# Patient Record
Sex: Female | Born: 1980 | Hispanic: Yes | Marital: Married | Smoking: Never smoker
Health system: Southern US, Community
[De-identification: ages and names within clinical notes are randomized; demographics above are authoritative.]

## PROBLEM LIST (undated history)

## (undated) ENCOUNTER — Inpatient Hospital Stay (HOSPITAL_COMMUNITY): Payer: Self-pay

## (undated) DIAGNOSIS — F419 Anxiety disorder, unspecified: Secondary | ICD-10-CM

## (undated) DIAGNOSIS — F329 Major depressive disorder, single episode, unspecified: Secondary | ICD-10-CM

## (undated) DIAGNOSIS — F32A Depression, unspecified: Secondary | ICD-10-CM

## (undated) HISTORY — PX: NO PAST SURGERIES: SHX2092

## (undated) HISTORY — PX: WISDOM TOOTH EXTRACTION: SHX21

---

## 2016-05-19 ENCOUNTER — Encounter (HOSPITAL_COMMUNITY): Payer: Self-pay | Admitting: Emergency Medicine

## 2016-05-19 ENCOUNTER — Emergency Department (HOSPITAL_COMMUNITY)
Admission: EM | Admit: 2016-05-19 | Discharge: 2016-05-19 | Disposition: A | Discharge status: Home / Self Care | Payer: BLUE CROSS/BLUE SHIELD | Attending: Emergency Medicine | Admitting: Emergency Medicine

## 2016-05-19 ENCOUNTER — Emergency Department (HOSPITAL_COMMUNITY): Payer: BLUE CROSS/BLUE SHIELD

## 2016-05-19 DIAGNOSIS — R064 Hyperventilation: Secondary | ICD-10-CM | POA: Diagnosis not present

## 2016-05-19 DIAGNOSIS — R0602 Shortness of breath: Secondary | ICD-10-CM | POA: Diagnosis present

## 2016-05-19 DIAGNOSIS — R0600 Dyspnea, unspecified: Secondary | ICD-10-CM | POA: Diagnosis not present

## 2016-05-19 LAB — CBC
HCT: 39.9 % (ref 36.0–46.0)
HEMOGLOBIN: 13.4 g/dL (ref 12.0–15.0)
MCH: 29.3 pg (ref 26.0–34.0)
MCHC: 33.6 g/dL (ref 30.0–36.0)
MCV: 87.3 fL (ref 78.0–100.0)
Platelets: 422 10*3/uL — ABNORMAL HIGH (ref 150–400)
RBC: 4.57 MIL/uL (ref 3.87–5.11)
RDW: 13.5 % (ref 11.5–15.5)
WBC: 13.2 10*3/uL — AB (ref 4.0–10.5)

## 2016-05-19 LAB — BASIC METABOLIC PANEL
ANION GAP: 13 (ref 5–15)
BUN: 9 mg/dL (ref 6–20)
CHLORIDE: 105 mmol/L (ref 101–111)
CO2: 18 mmol/L — ABNORMAL LOW (ref 22–32)
CREATININE: 0.93 mg/dL (ref 0.44–1.00)
Calcium: 9.6 mg/dL (ref 8.9–10.3)
GFR calc non Af Amer: >60 mL/min (ref >60)
Glucose, Bld: 159 mg/dL — ABNORMAL HIGH (ref 65–99)
POTASSIUM: 3.7 mmol/L (ref 3.5–5.1)
Sodium: 136 mmol/L (ref 135–145)

## 2016-05-19 LAB — I-STAT TROPONIN, ED: TROPONIN I, POC: 0 ng/mL (ref 0.00–0.08)

## 2016-05-19 MED ORDER — LORAZEPAM 1 MG PO TABS
1.0000 mg | ORAL_TABLET | Freq: Once | ORAL | Status: AC
  Administered 2016-05-19: 1 mg via ORAL
  Filled 2016-05-19: qty 1

## 2016-05-19 NOTE — ED Provider Notes (Signed)
[facility]-EMERGENCY DEPT Provider Note   CSN: [id number] Arrival date & time: 05/19/16  2033  First Provider Contact:  None       History   Chief Complaint Chief Complaint  Patient presents with  . Shortness of Breath    HPI Sara Burns is a 35 y.o. female.  Patient c/o chest tightness, sob, gradual onset earlier today.  Patient w hx allergies. Also notes similar episodes sob/hyperventilation in past. Patient c/o tingling bil hands/feet, and spasm in wrists/feet. No lateralizing or focal chest pain. No exertional cp or discomfort. No pleuritic pain. No leg pain or swelling. No hx dvt or pe. No recent surgery, trauma, immobility, travel, smoking, hemoptysis. No fam hx cad. Denies chest wall injury or strain. Denies cough or fever. No hx asthma or mdi use.    The history is provided by the patient and the spouse.  Shortness of Breath  Pertinent negatives include no fever, no headaches, no sore throat, no neck pain, no cough, no vomiting, no abdominal pain, no rash and no leg swelling.    History reviewed. No pertinent past medical history.  There are no active problems to display for this patient.   History reviewed. No pertinent surgical history.  OB History    No data available       Home Medications    Prior to Admission medications   Not on File    Family History History reviewed. No pertinent family history.  Social History Social History  Substance Use Topics  . Smoking status: Never Smoker  . Smokeless tobacco: Never Used  . Alcohol use Yes     Comment: occasion     Allergies   Other   Review of Systems Review of Systems  Constitutional: Negative for chills and fever.  HENT: Negative for sore throat.   Eyes: Negative for redness.  Respiratory: Positive for shortness of breath. Negative for cough.   Cardiovascular: Negative for leg swelling.  Gastrointestinal: Negative for abdominal pain and vomiting.  Genitourinary: Negative for flank  pain.  Musculoskeletal: Negative for back pain and neck pain.  Skin: Negative for rash.  Neurological: Negative for headaches.  Hematological: Does not bruise/bleed easily.  Psychiatric/Behavioral: The patient is nervous/anxious.      Physical Exam Updated Vital Signs BP 108/71 (BP Location: Left Arm)   Pulse 107   Temp 98.1 F (36.7 C) (Axillary)   Resp 22   Ht  (1.727 m)   Wt 59 kg   LMP 04/28/2016 (Approximate)   SpO2 100%   BMI 19.77 kg/m   Physical Exam  Constitutional: She appears well-developed and well-nourished.  Patient very anxious, hyperventilating.   HENT:  Mouth/Throat: Oropharynx is clear and moist.  Eyes: Conjunctivae are normal. Pupils are equal, round, and reactive to light. No scleral icterus.  Neck: Normal range of motion. Neck supple. No tracheal deviation present. No thyromegaly present.  Cardiovascular: Regular rhythm, normal heart sounds and intact distal pulses.  Exam reveals no gallop and no friction rub.   No murmur heard. Pulmonary/Chest: Effort normal and breath sounds normal. No respiratory distress.  Abdominal: Soft. Normal appearance and bowel sounds are normal. She exhibits no distension. There is no tenderness.  Musculoskeletal: She exhibits no edema or tenderness.  Neurological: She is alert.  Skin: Skin is warm and dry. No rash noted.  Psychiatric:  Very anxious.   Nursing note and vitals reviewed.    ED Treatments / Results  Labs (all labs ordered are listed, but only abnormal  results are displayed) Results for orders placed or performed during the hospital encounter of 05/19/16  CBC  Result Value Ref Range   WBC 13.2 (H) 4.0 - 10.5 K/uL   RBC 4.57 3.87 - 5.11 MIL/uL   Hemoglobin 13.4 12.0 - 15.0 g/dL   HCT 16.1 09.6 - 04.5 %   MCV 87.3 78.0 - 100.0 fL   MCH 29.3 26.0 - 34.0 pg   MCHC 33.6 30.0 - 36.0 g/dL   RDW 40.9 81.1 - 91.4 %   Platelets 422 (H) 150 - 400 K/uL  Basic metabolic panel  Result Value Ref Range    Sodium 136 135 - 145 mmol/L   Potassium 3.7 3.5 - 5.1 mmol/L   Chloride 105 101 - 111 mmol/L   CO2 18 (L) 22 - 32 mmol/L   Glucose, Bld 159 (H) 65 - 99 mg/dL   BUN 9 6 - 20 mg/dL   Creatinine, Ser 7.82 0.44 - 1.00 mg/dL   Calcium 9.6 8.9 - 95.6 mg/dL   GFR calc non Af Amer >60 >60 mL/min   GFR calc Af Amer >60 >60 mL/min   Anion gap 13 5 - 15  I-stat troponin, ED  Result Value Ref Range   Troponin i, poc 0.00 0.00 - 0.08 ng/mL   Comment 3           Dg Chest 2 View  Result Date: 05/19/2016 CLINICAL DATA:  SOB of breath began this afternoon, worsening throughout the evening; Gasping for air; Pt states both arms have some numbness and tingling; She feels very cold EXAM: CHEST  2 VIEW COMPARISON:  None. FINDINGS: The heart size and mediastinal contours are within normal limits. Both lungs are clear. The visualized skeletal structures are unremarkable. IMPRESSION: No active cardiopulmonary disease. Electronically Signed   By: Burman Nieves M.D.   On: 05/19/2016 21:19    EKG  EKG Interpretation  Date/Time:  Sunday May 19 2016 20:47:36 EDT Ventricular Rate:  119 PR Interval:  164 QRS Duration: 92 QT Interval:  216 QTC Calculation: 303 R Axis:   92 Text Interpretation:  Sinus tachycardia Rightward axis Nonspecific ST and T wave abnormality No previous tracing Confirmed by Denton Lank  MD, Caryn Bee ([id number]) on 05/19/2016 9:18:34 PM       Radiology Dg Chest 2 View  Result Date: 05/19/2016 CLINICAL DATA:  SOB of breath began this afternoon, worsening throughout the evening; Gasping for air; Pt states both arms have some numbness and tingling; She feels very cold EXAM: CHEST  2 VIEW COMPARISON:  None. FINDINGS: The heart size and mediastinal contours are within normal limits. Both lungs are clear. The visualized skeletal structures are unremarkable. IMPRESSION: No active cardiopulmonary disease. Electronically Signed   By: Burman Nieves M.D.   On: 05/19/2016 21:19     Procedures Procedures (including critical care time)  Medications Ordered in ED Medications  LORazepam (ATIVAN) tablet 1 mg (not administered)     Initial Impression / Assessment and Plan / ED Course  I have reviewed the triage vital signs and the nursing notes.  Pertinent labs & imaging results that were available during my care of the patient were reviewed by me and considered in my medical decision making (see chart for details).  Clinical Course    Patient reassured, encouraged to slow breathing.   Ativan 1 mg po.  cxr from triage, neg acute.   Recheck, pt calm and alert. No sob or increased wob.  Patient currently appears stable for d/c.  Final Clinical Impressions(s) / ED Diagnoses   Final diagnoses:  None    New Prescriptions New Prescriptions   No medications on file     Cathren LaineKevin Chon Buhl, MD 05/19/16 2250

## 2016-05-19 NOTE — Discharge Instructions (Signed)
It was our pleasure to provide your ER care today - we hope that you feel better.  Rest.   Follow up with primary care doctor in the coming week.  Return to ER if worse, new symptoms, fevers, trouble breathing, chest pain, other concern.   You were given medication in the ED that causes drowsiness - no driving for the next 4 hours.

## 2016-05-19 NOTE — ED Triage Notes (Signed)
Patient will not talk or give any information about her condition other than stating she cannot breathe. Husband states she had SOB, began breathing very fast, and her hands felt numb and spasmed. Woke up with SOB, worsened throughout the day. Sats 100% on room air.

## 2016-05-19 NOTE — ED Notes (Signed)
Pt noted to have increased respiratory rate and c/o numbness/tingling in fingers. Pt coached to slow her breathing.

## 2016-10-30 ENCOUNTER — Ambulatory Visit (HOSPITAL_COMMUNITY)
Admission: EM | Admit: 2016-10-30 | Discharge: 2016-10-30 | Disposition: A | Discharge status: Home / Self Care | Payer: BLUE CROSS/BLUE SHIELD | Attending: Emergency Medicine | Admitting: Emergency Medicine

## 2016-10-30 ENCOUNTER — Encounter (HOSPITAL_COMMUNITY): Payer: Self-pay | Admitting: Emergency Medicine

## 2016-10-30 DIAGNOSIS — J0100 Acute maxillary sinusitis, unspecified: Secondary | ICD-10-CM | POA: Diagnosis not present

## 2016-10-30 MED ORDER — FLUCONAZOLE 150 MG PO TABS: 150.0000 mg | ORAL_TABLET | Freq: Every day | ORAL | 1 refills | Status: DC

## 2016-10-30 MED ORDER — IPRATROPIUM BROMIDE 0.06 % NA SOLN: 2.0000 | Freq: Four times a day (QID) | NASAL | 0 refills | Status: DC

## 2016-10-30 MED ORDER — AMOXICILLIN 875 MG PO TABS: 875.0000 mg | ORAL_TABLET | Freq: Two times a day (BID) | ORAL | 0 refills | Status: DC

## 2016-10-30 MED ORDER — METHYLPREDNISOLONE 4 MG PO TBPK: ORAL_TABLET | ORAL | 0 refills | Status: DC

## 2016-10-30 NOTE — ED Triage Notes (Signed)
Patient has symptoms for 2 weeks.  Patient has runny nose and nasal stuffiness, and having a bad headache

## 2016-10-30 NOTE — ED Provider Notes (Signed)
CSN: [id number]     Arrival date & time 10/30/16  1647 History   First MD Initiated Contact with Patient 10/30/16 1720     Chief Complaint  Patient presents with  . URI   (Consider location/radiation/quality/duration/timing/severity/associated sxs/prior Treatment) Patient is having sinus pressure and teeth discomfort and having nasal discharge x 2 weeks.     The history is provided by the patient.  URI  Presenting symptoms: congestion, facial pain, fatigue, fever and rhinorrhea   Presenting symptoms: no cough   Severity:  Moderate Onset quality:  Sudden Duration:  2 weeks Timing:  Constant Progression:  Worsening Chronicity:  New Relieved by:  Nothing Worsened by:  Nothing Ineffective treatments:  None tried   History reviewed. No pertinent past medical history. History reviewed. No pertinent surgical history. No family history on file. Social History  Substance Use Topics  . Smoking status: Never Smoker  . Smokeless tobacco: Never Used  . Alcohol use Yes     Comment: occasion   OB History    No data available     Review of Systems  Constitutional: Positive for fatigue and fever.  HENT: Positive for congestion and rhinorrhea.   Eyes: Negative.   Respiratory: Negative.  Negative for cough.   Cardiovascular: Negative.   Gastrointestinal: Negative.   Endocrine: Negative.   Genitourinary: Negative.   Musculoskeletal: Negative.   Allergic/Immunologic: Negative.   Neurological: Negative.   Hematological: Negative.   Psychiatric/Behavioral: Negative.     Allergies  Apple  Home Medications   Prior to Admission medications   Medication Sig Start Date End Date Taking? Authorizing Provider  amoxicillin (AMOXIL) 875 MG tablet Take 1 tablet (875 mg total) by mouth 2 (two) times daily. 10/30/16   Deatra CanterWilliam J Oxford, FNP  fluconazole (DIFLUCAN) 150 MG tablet Take 1 tablet (150 mg total) by mouth daily. 10/30/16   Deatra CanterWilliam J Oxford, FNP  ibuprofen (ADVIL,MOTRIN) 200 MG  tablet Take 400 mg by mouth 2 (two) times daily as needed for cramping.    Historical Provider, MD  ipratropium (ATROVENT) 0.06 % nasal spray Place 2 sprays into both nostrils 4 (four) times daily. 10/30/16   Deatra CanterWilliam J Oxford, FNP  loratadine (CLARITIN) 10 MG tablet Take 10 mg by mouth daily as needed for allergies.    Historical Provider, MD  methylPREDNISolone (MEDROL DOSEPAK) 4 MG TBPK tablet Take 6-5-4-3-2-1 po qd 10/30/16   Deatra CanterWilliam J Oxford, FNP  metroNIDAZOLE (FLAGYL) 500 MG tablet Take 500 mg by mouth 2 (two) times daily. 7 day course started 05/13/16 05/13/16   Historical Provider, MD  norethindrone-ethinyl estradiol (MICROGESTIN FE 1/20) 1-20 MG-MCG tablet Take 1 tablet by mouth at bedtime.    Historical Provider, MD   Meds Ordered and Administered this Visit  Medications - No data to display  Pulse 79   Temp 97.8 F (36.6 C) (Oral)   Resp 16   LMP 10/26/2016   SpO2 98%  No data found.   Physical Exam  Constitutional: She appears well-developed and well-nourished.  HENT:  Head: Normocephalic and atraumatic.  Right Ear: External ear normal.  Left Ear: External ear normal.  Mouth/Throat: Oropharynx is clear and moist.  Eyes: Conjunctivae and EOM are normal. Pupils are equal, round, and reactive to light.  Neck: Normal range of motion. Neck supple.  Cardiovascular: Normal rate, regular rhythm and normal heart sounds.   Pulmonary/Chest: Effort normal and breath sounds normal.  Abdominal: Soft. Bowel sounds are normal.  Nursing note and vitals reviewed.   Urgent  Care Course     Procedures (including critical care time)  Labs Review Labs Reviewed - No data to display  Imaging Review No results found.   Visual Acuity Review  Right Eye Distance:   Left Eye Distance:   Bilateral Distance:    Right Eye Near:   Left Eye Near:    Bilateral Near:         MDM   1. Subacute maxillary sinusitis    Amoxicillin 875mg  one po bid x 10 days Medrol dose pack as directed  4mg  #21 Ipratropium Nasal Spray 2 sprays per nostril Diflucan 150mg  one po qd x 2d w/ 1rf      Deatra Canter, FNP 10/30/16 1731

## 2017-01-21 ENCOUNTER — Encounter (HOSPITAL_COMMUNITY): Payer: Self-pay | Admitting: Emergency Medicine

## 2017-01-21 ENCOUNTER — Ambulatory Visit (HOSPITAL_COMMUNITY)
Admission: EM | Admit: 2017-01-21 | Discharge: 2017-01-21 | Disposition: A | Discharge status: Home / Self Care | Payer: BLUE CROSS/BLUE SHIELD | Attending: Internal Medicine | Admitting: Internal Medicine

## 2017-01-21 DIAGNOSIS — J309 Allergic rhinitis, unspecified: Secondary | ICD-10-CM

## 2017-01-21 DIAGNOSIS — J301 Allergic rhinitis due to pollen: Secondary | ICD-10-CM

## 2017-01-21 MED ORDER — METHYLPREDNISOLONE 4 MG PO TBPK: ORAL_TABLET | ORAL | 0 refills | Status: DC

## 2017-01-21 NOTE — ED Provider Notes (Signed)
CSN: [id number]     Arrival date & time 01/21/17  1112 History   First MD Initiated Contact with Patient 01/21/17 1142     Chief Complaint  Patient presents with  . URI   (Consider location/radiation/quality/duration/timing/severity/associated sxs/prior Treatment) 36 year old female with a history of allergies presents with runny nose, facial pressure around the eyes and paranasal sinuses and forehead, decreased energy,  and sniffles. She states initially the drainage was clear and now has turned to a different color. Denies any fever. She denies perceived PND. Symptoms started approximate 7 days ago. She has tried some pills but after taking one or 2 it did not work. She does not like to use nasal sprays.      History reviewed. No pertinent past medical history. History reviewed. No pertinent surgical history. No family history on file. Social History  Substance Use Topics  . Smoking status: Never Smoker  . Smokeless tobacco: Never Used  . Alcohol use Yes     Comment: occasion   OB History    No data available     Review of Systems  Constitutional: Positive for fatigue. Negative for activity change, appetite change, chills and fever.  HENT: Positive for congestion, postnasal drip, rhinorrhea, sinus pain and sinus pressure. Negative for ear pain, facial swelling and sore throat.   Eyes: Negative.   Respiratory: Negative.   Cardiovascular: Negative.   Musculoskeletal: Negative for neck pain and neck stiffness.  Skin: Negative for pallor and rash.  Neurological: Negative.   All other systems reviewed and are negative.   Allergies  Apple  Home Medications   Prior to Admission medications   Medication Sig Start Date End Date Taking? Authorizing Provider  ibuprofen (ADVIL,MOTRIN) 200 MG tablet Take 400 mg by mouth 2 (two) times daily as needed for cramping.    Historical Provider, MD  loratadine (CLARITIN) 10 MG tablet Take 10 mg by mouth daily as needed for allergies.     Historical Provider, MD  methylPREDNISolone (MEDROL DOSEPAK) 4 MG TBPK tablet follow package directions 01/21/17   Hayden Rasmussen, NP  norethindrone-ethinyl estradiol (MICROGESTIN FE 1/20) 1-20 MG-MCG tablet Take 1 tablet by mouth at bedtime.    Historical Provider, MD   Meds Ordered and Administered this Visit  Medications - No data to display  BP 113/81 (BP Location: Left Arm)   Pulse 88   Temp 98.4 F (36.9 C) (Oral)   Resp 16   LMP 01/14/2017   SpO2 100%  No data found.   Physical Exam  Constitutional: She is oriented to person, place, and time. She appears well-developed and well-nourished. No distress.  HENT:  Mouth/Throat: No oropharyngeal exudate.  Bilateral TMs are mildly retracted. Oropharynx with erythema, light cobblestoning and moderate amount of clear PND.  Eyes: EOM are normal.  Neck: Normal range of motion. Neck supple.  Cardiovascular: Normal rate, regular rhythm and normal heart sounds.   Pulmonary/Chest: Effort normal and breath sounds normal. No respiratory distress. She has no wheezes. She has no rales.  Musculoskeletal: Normal range of motion. She exhibits no edema.  Lymphadenopathy:    She has no cervical adenopathy.  Neurological: She is alert and oriented to person, place, and time.  Skin: Skin is warm and dry. No rash noted.  Psychiatric: She has a normal mood and affect.  Nursing note and vitals reviewed.   Urgent Care Course     Procedures (including critical care time)  Labs Review Labs Reviewed - No data to display  Imaging Review  No results found.   Visual Acuity Review  Right Eye Distance:   Left Eye Distance:   Bilateral Distance:    Right Eye Near:   Left Eye Near:    Bilateral Near:         MDM   1. Allergic rhinitis due to pollen, unspecified seasonality   2. Allergic sinusitis    The most likely reason for your sinus congestion, runny nose and drainage in the back of the throat is due to allergies. It is important that  you take the medication as directed. Be sure to start taking the Medrol dosepak as soon as possible. The following list of medications will also help with your symptoms. You have to be consistent with taking the medicines. One or 2 doses will not be sufficient. Sudafed PE 10 mg every 4 to 6 hours as needed for congestion Allegra or Zyrtec daily as needed for drainage and runny nose. For stronger antihistamine may take Chlor-Trimeton 2 to 4 mg every 4 to 6 hours, may cause drowsiness. Saline nasal spray used frequently. Ibuprofen 600 mg every 6 hours as needed for pain, discomfort or fever but only after completing the prednisone. Drink plenty of fluids and stay well-hydrated.    Hayden Rasmussen, NP 01/21/17 [phone]

## 2017-01-21 NOTE — Discharge Instructions (Signed)
The most likely reason for your sinus congestion, runny nose and drainage in the back of the throat is due to allergies. It is important that you take the medication as directed. Be sure to start taking the Medrol dosepak as soon as possible. The following list of medications will also help with your symptoms. You have to be consistent with taking the medicines. One or 2 doses will not be sufficient. Sudafed PE 10 mg every 4 to 6 hours as needed for congestion Allegra or Zyrtec daily as needed for drainage and runny nose. For stronger antihistamine may take Chlor-Trimeton 2 to 4 mg every 4 to 6 hours, may cause drowsiness. Saline nasal spray used frequently. Ibuprofen 600 mg every 6 hours as needed for pain, discomfort or fever but only after completing the prednisone. Drink plenty of fluids and stay well-hydrated. Flonase or Rhinocort nasal spray daily

## 2017-01-21 NOTE — ED Triage Notes (Signed)
Symptoms started a week ago.  Symptoms include a runny nose, head congestion, no energy, facial pain.

## 2017-05-06 IMAGING — DX DG CHEST 2V
2 series · 2 of 2 positions shown · non-contrast
Comparison: None.

CLINICAL DATA: SOB of breath began this afternoon, worsening
throughout the evening; Gasping for air; Pt states both arms have
some numbness and tingling; She feels very cold

EXAM:
CHEST  2 VIEW

[chest pa]
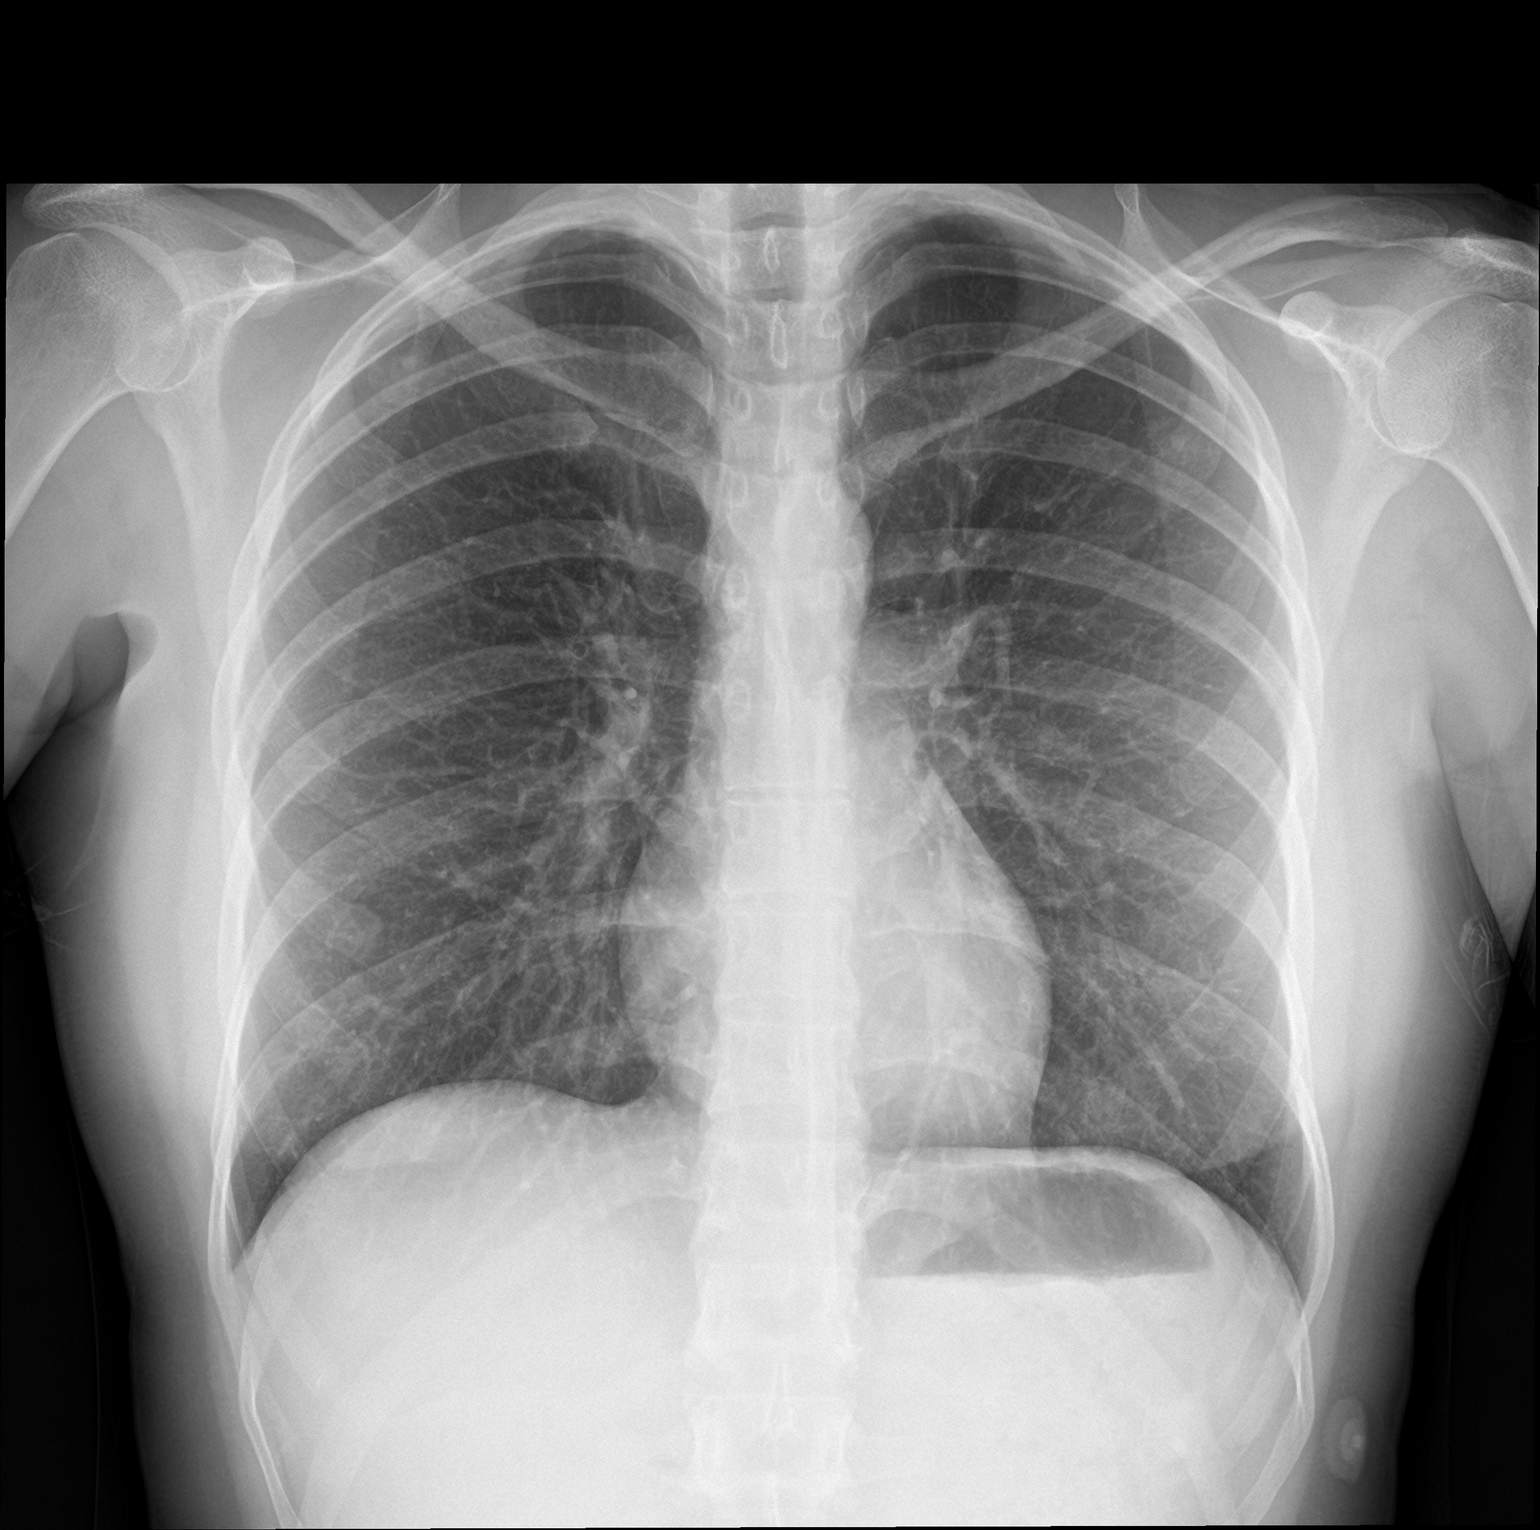

[chest lat]
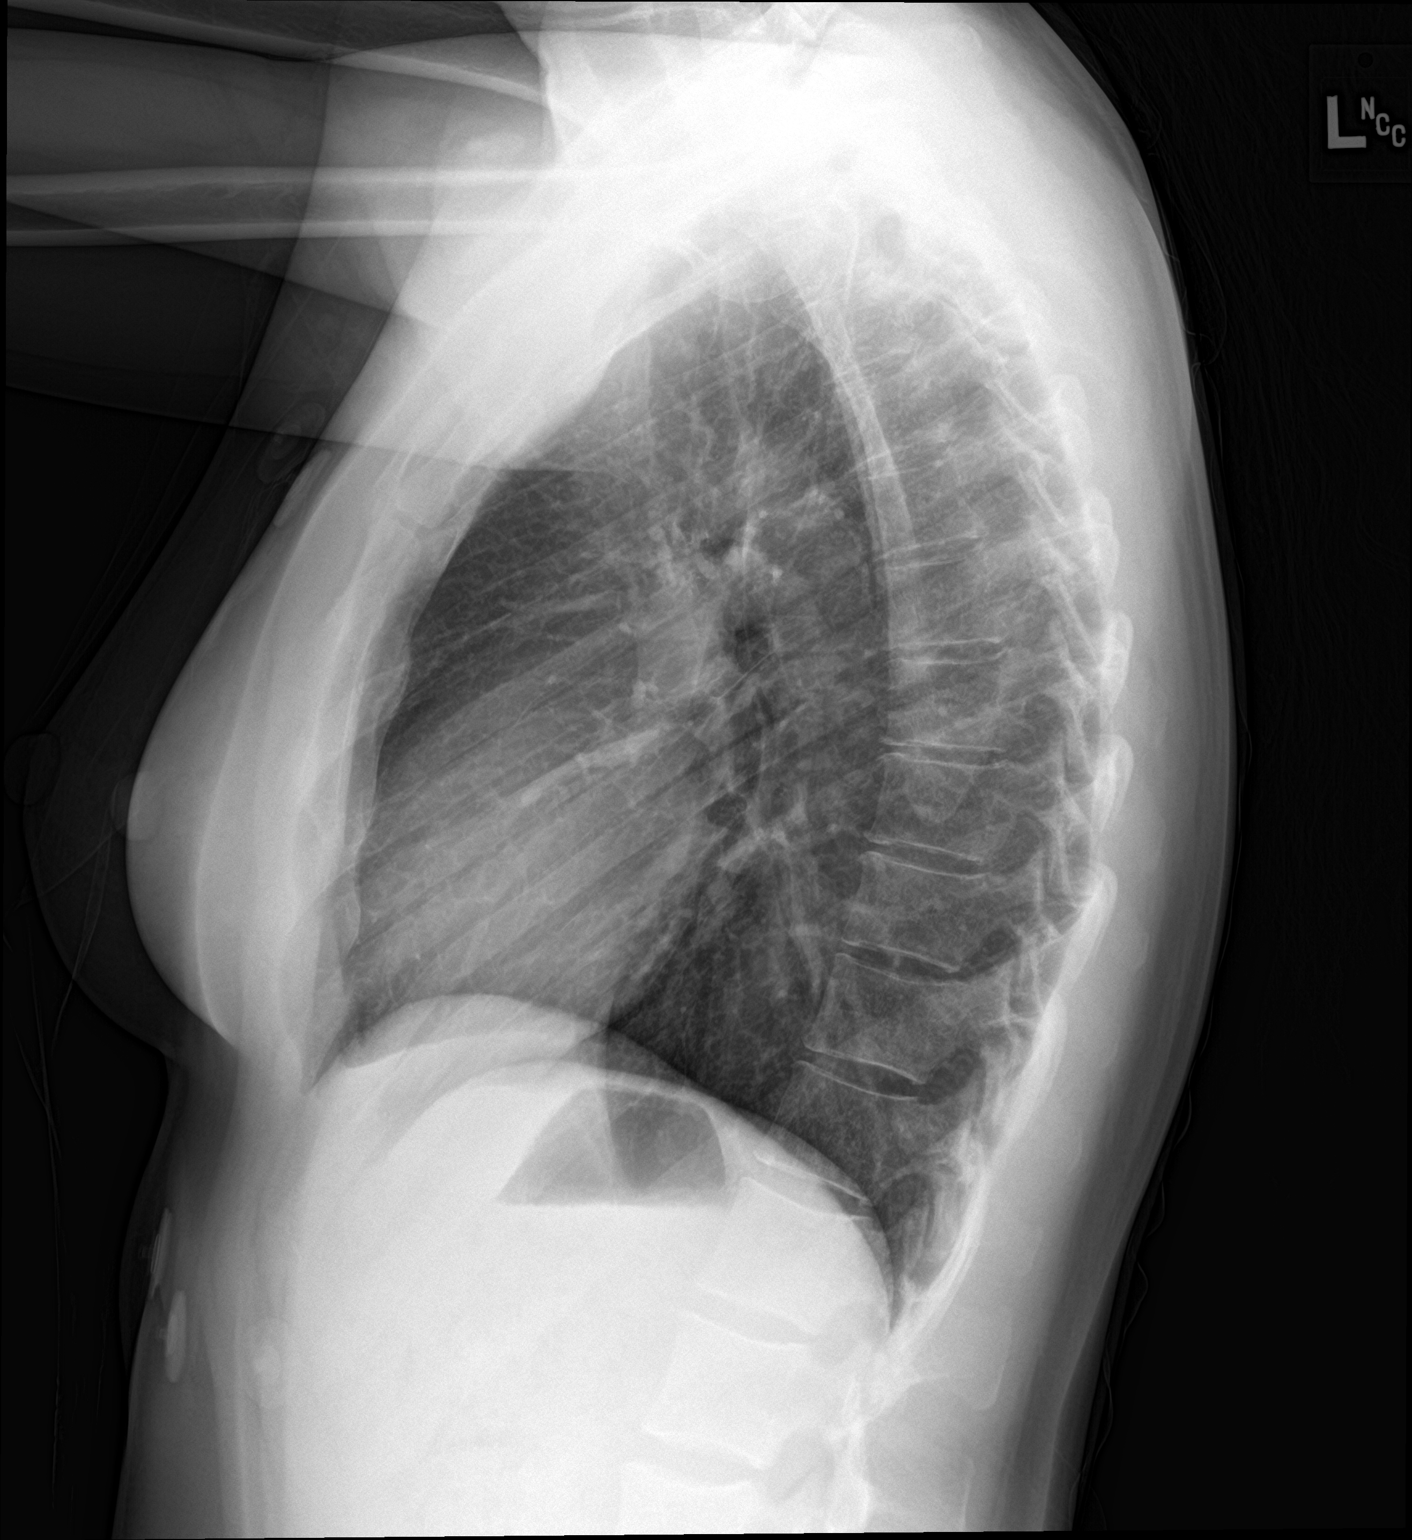

[2 of 2 positions shown; findings below may reference images not displayed]

FINDINGS: The heart size and mediastinal contours are within normal limits.
Both lungs are clear. The visualized skeletal structures are
unremarkable.
IMPRESSION: No active cardiopulmonary disease.

## 2017-07-24 LAB — OB RESULTS CONSOLE RUBELLA ANTIBODY, IGM: Rubella: IMMUNE

## 2017-07-24 LAB — OB RESULTS CONSOLE GC/CHLAMYDIA
Chlamydia: NEGATIVE
GC PROBE AMP, GENITAL: NEGATIVE

## 2017-07-24 LAB — OB RESULTS CONSOLE ABO/RH: RH Type: POSITIVE

## 2017-07-24 LAB — OB RESULTS CONSOLE HIV ANTIBODY (ROUTINE TESTING): HIV: NONREACTIVE

## 2017-07-24 LAB — OB RESULTS CONSOLE HEPATITIS B SURFACE ANTIGEN: Hepatitis B Surface Ag: NEGATIVE

## 2017-07-24 LAB — OB RESULTS CONSOLE RPR: RPR: NONREACTIVE

## 2017-07-24 LAB — OB RESULTS CONSOLE ANTIBODY SCREEN: ANTIBODY SCREEN: NEGATIVE

## 2017-10-04 ENCOUNTER — Encounter (HOSPITAL_COMMUNITY): Payer: Self-pay | Admitting: *Deleted

## 2017-10-04 ENCOUNTER — Other Ambulatory Visit: Payer: Self-pay

## 2017-10-04 ENCOUNTER — Observation Stay (HOSPITAL_COMMUNITY)
Admission: AD | Admit: 2017-10-04 | Discharge: 2017-10-06 | Disposition: A | Discharge status: Home / Self Care | Payer: BLUE CROSS/BLUE SHIELD | Source: Ambulatory Visit | Attending: Obstetrics and Gynecology | Admitting: Obstetrics and Gynecology

## 2017-10-04 DIAGNOSIS — Z3A21 21 weeks gestation of pregnancy: Secondary | ICD-10-CM | POA: Diagnosis not present

## 2017-10-04 DIAGNOSIS — O23512 Infections of cervix in pregnancy, second trimester: Secondary | ICD-10-CM | POA: Diagnosis not present

## 2017-10-04 DIAGNOSIS — Z8751 Personal history of pre-term labor: Secondary | ICD-10-CM | POA: Diagnosis not present

## 2017-10-04 DIAGNOSIS — B9689 Other specified bacterial agents as the cause of diseases classified elsewhere: Secondary | ICD-10-CM | POA: Diagnosis not present

## 2017-10-04 DIAGNOSIS — Z3686 Encounter for antenatal screening for cervical length: Secondary | ICD-10-CM | POA: Insufficient documentation

## 2017-10-04 DIAGNOSIS — O479 False labor, unspecified: Secondary | ICD-10-CM | POA: Diagnosis present

## 2017-10-04 DIAGNOSIS — Z79899 Other long term (current) drug therapy: Secondary | ICD-10-CM | POA: Diagnosis not present

## 2017-10-04 DIAGNOSIS — O4700 False labor before 37 completed weeks of gestation, unspecified trimester: Secondary | ICD-10-CM

## 2017-10-04 DIAGNOSIS — O4702 False labor before 37 completed weeks of gestation, second trimester: Secondary | ICD-10-CM | POA: Diagnosis not present

## 2017-10-04 LAB — WET PREP, GENITAL
Sperm: NONE SEEN
TRICH WET PREP: NONE SEEN
YEAST WET PREP: NONE SEEN

## 2017-10-04 LAB — URINALYSIS, ROUTINE W REFLEX MICROSCOPIC
BILIRUBIN URINE: NEGATIVE
Glucose, UA: NEGATIVE mg/dL
HGB URINE DIPSTICK: NEGATIVE
KETONES UR: NEGATIVE mg/dL
NITRITE: NEGATIVE
PH: 6 (ref 5.0–8.0)
Protein, ur: NEGATIVE mg/dL
Specific Gravity, Urine: 1.008 (ref 1.005–1.030)

## 2017-10-04 MED ORDER — LACTATED RINGERS IV BOLUS (SEPSIS)
1000.0000 mL | Freq: Once | INTRAVENOUS | Status: AC
  Administered 2017-10-05: 1000 mL via INTRAVENOUS

## 2017-10-04 MED ORDER — NIFEDIPINE 10 MG PO CAPS
20.0000 mg | ORAL_CAPSULE | Freq: Once | ORAL | Status: AC
  Administered 2017-10-04: 20 mg via ORAL
  Filled 2017-10-04: qty 2

## 2017-10-04 NOTE — MAU Note (Signed)
Sara Burns CNM aware of bp of 90/65. OK to give po Procardia 20mg  as ordered

## 2017-10-04 NOTE — Progress Notes (Signed)
toco only applied as pt concerned may be contracting.

## 2017-10-04 NOTE — MAU Provider Note (Signed)
CC:  Chief Complaint  Patient presents with  . Abdominal Pain  . Back Pain     First Provider Initiated Contact with Patient 10/04/17 2300      HPI: Sara Burns is a 36 y.o. year old G3P0111 female at 6668w3d weeks gestation who presents to MAU reporting contractions since this morning that are now every 5 minutes. Pt denies any problems w/ preterm contractions this pregnancy. State she was told that her anatomy US was Nml. Records not available for review. Hx spontaneous PTD at 36 weeks w/ first baby. Not on 17-P.  Associated Sx:  Vaginal bleeding: denies Leaking of fluid: denies Fetal movement: Nml  O:  Patient Vitals for the past 24 hrs:  BP Temp Pulse Resp Height Weight  10/04/17 2140 (!) 105/58 98 F (36.7 C) 94 18 5\' 6"  (1.676 m) 139 lb (63 kg)    General: NAD Heart: Regular rate Lungs: Normal rate and effort Abd: Soft, NT, Gravid, S=D Pelvic: NEFG, Neg pooling, Neg  blood.  Dilation: Closed/50 Exam by:: Dorathy KinsmanVirginia Lalitha Ilyas CNM  FHR 145 by doppler Toco: Contractions every 2-5 minutes, mild-moderate  [place] Mfm Ob Transvaginal OB History  Gravidity:    3         Term:   0        Prem:   1        SAB:   1  TOP:          0       Ectopic:  0        Living: 1  Fetal Evaluation  Num Of Fetuses:     1   Fetal Heart         159  Rate(bpm)  Presentation:       Cephalic   Placenta:           Posterior, above cervical os   Amniotic Fluid  AFI FV:      Subjectively within normal limits  Gestational Age  Clinical EDD:  21w 4d   EDD:   02/11/18  Best:          21w 4d     Det. By:  Clinical EDD             EDD:   02/11/18 Cervix Uterus Adnexa  Cervix  Length: 3.2  cm.  Normal appearance by transvaginal scan   Uterus  ? Gartner duct cyst noted measuring 2.9 x 2.3 x 0.9cm.   Impression  Indication: 36 yr old G3P0111 at 5943w4d with abdominal pain  for cervical length. Remote read.  Findings:  1. Single intrauterine pregnancy.  2. Posterior placenta without evidence of previa.  4.  Normal amniotic fluid volume.  5. Normal transvaginal cervical length.  Recommendations  1. Normal cervical length.  2. Management per primary OB. Erle CrockerKristen H Quinn, MD Electronically Signed Final Report   10/05/2017 11:54 pm   MAU COURSE/MDM Orders Placed This Encounter  Procedures  . US MFM OB Transvaginal  . Urinalysis, Routine w reflex microscopic  . Insert peripheral IV   Meds ordered this encounter  Medications  . NIFEdipine (PROCARDIA) capsule 20 mg  . lactated ringers bolus 1,000 mL   Discussed Hx, labs, exam w/ Dr. Dareen PianoAnderson. Agrees w/ POC. New orders: Procardia, IV bolus, US for CL.   Informed Dr. Dareen PianoAnderson of Nml CL per US, but no improvement of contractions. Will admit for Mag.  A: 5868w3d week IUP Frequent preterm contractions FHR reactive  P: Admit to  L&D per consult w/ Levi AlandAnderson, Mark E, MD  Mag Sulfate  [place], [place], [place] 10/04/2017 11:21 PM  3

## 2017-10-04 NOTE — Progress Notes (Signed)
Po flds to pt

## 2017-10-04 NOTE — MAU Note (Signed)
Pain in lower abd and lower back since this am. Getting worse. Comes and goes. Denies LOF or bleeding.

## 2017-10-05 ENCOUNTER — Inpatient Hospital Stay (HOSPITAL_COMMUNITY): Payer: BLUE CROSS/BLUE SHIELD

## 2017-10-05 DIAGNOSIS — O479 False labor, unspecified: Secondary | ICD-10-CM | POA: Diagnosis present

## 2017-10-05 DIAGNOSIS — O4700 False labor before 37 completed weeks of gestation, unspecified trimester: Secondary | ICD-10-CM | POA: Diagnosis present

## 2017-10-05 DIAGNOSIS — Z3A21 21 weeks gestation of pregnancy: Secondary | ICD-10-CM

## 2017-10-05 LAB — ABO/RH: ABO/RH(D): B POS

## 2017-10-05 LAB — TYPE AND SCREEN
ABO/RH(D): B POS
Antibody Screen: NEGATIVE

## 2017-10-05 MED ORDER — ZOLPIDEM TARTRATE 5 MG PO TABS: 5.0000 mg | ORAL_TABLET | Freq: Every evening | ORAL | Status: DC | PRN

## 2017-10-05 MED ORDER — MAGNESIUM SULFATE BOLUS VIA INFUSION
4.0000 g | Freq: Once | INTRAVENOUS | Status: AC
  Administered 2017-10-05: 4 g via INTRAVENOUS
  Filled 2017-10-05: qty 500

## 2017-10-05 MED ORDER — DOCUSATE SODIUM 100 MG PO CAPS
100.0000 mg | ORAL_CAPSULE | Freq: Every day | ORAL | Status: DC
Start: 2017-10-05 — End: 2017-10-06
  Administered 2017-10-05: 100 mg via ORAL
  Filled 2017-10-05: qty 1

## 2017-10-05 MED ORDER — LACTATED RINGERS IV SOLN
INTRAVENOUS | Status: DC
  Administered 2017-10-05 (×3): via INTRAVENOUS

## 2017-10-05 MED ORDER — CALCIUM CARBONATE ANTACID 500 MG PO CHEW: 2.0000 | CHEWABLE_TABLET | ORAL | Status: DC | PRN

## 2017-10-05 MED ORDER — MAGNESIUM SULFATE 40 G IN LACTATED RINGERS - SIMPLE
2.0000 g/h | INTRAVENOUS | Status: DC
  Administered 2017-10-05 (×2): 2 g/h via INTRAVENOUS
  Filled 2017-10-05 (×2): qty 40

## 2017-10-05 MED ORDER — PRENATAL MULTIVITAMIN CH
1.0000 | ORAL_TABLET | Freq: Every day | ORAL | Status: DC
Start: 2017-10-05 — End: 2017-10-06
  Administered 2017-10-05: 1 via ORAL
  Filled 2017-10-05: qty 1

## 2017-10-05 MED ORDER — ACETAMINOPHEN 325 MG PO TABS: 650.0000 mg | ORAL_TABLET | ORAL | Status: DC | PRN

## 2017-10-05 MED ORDER — METRONIDAZOLE 500 MG PO TABS
500.0000 mg | ORAL_TABLET | Freq: Two times a day (BID) | ORAL | Status: DC
  Administered 2017-10-05 (×2): 500 mg via ORAL
  Filled 2017-10-05 (×2): qty 1

## 2017-10-05 MED ORDER — BETAMETHASONE SOD PHOS & ACET 6 (3-3) MG/ML IJ SUSP
12.0000 mg | Freq: Once | INTRAMUSCULAR | Status: AC
  Administered 2017-10-05: 12 mg via INTRAMUSCULAR
  Filled 2017-10-05: qty 2

## 2017-10-05 MED ORDER — NIFEDIPINE 10 MG PO CAPS: 10.0000 mg | ORAL_CAPSULE | Freq: Once | ORAL | Status: DC

## 2017-10-05 MED ORDER — TERBUTALINE SULFATE 1 MG/ML IJ SOLN
0.2500 mg | Freq: Once | INTRAMUSCULAR | Status: AC
  Administered 2017-10-05: 0.25 mg via SUBCUTANEOUS
  Filled 2017-10-05: qty 1

## 2017-10-05 NOTE — Progress Notes (Signed)
RN called provider- Dr. Dareen PianoAnderson for orders. Pt was contraction frequently 2-6 and rating her pain 8/10. Dr. Dareen PianoAnderson ordered Procardia 10 mg po x 1.

## 2017-10-05 NOTE — Progress Notes (Signed)
To us via wc.

## 2017-10-05 NOTE — Progress Notes (Signed)
To [facility] via w/c

## 2017-10-05 NOTE — H&P (Signed)
Pt is a 36 y/o hispanic female, [id number] at 5421 4/7 wks who presented to the ER c/o LAP which began early Sat am. She was noted to be having Ctxs q 2-4 mins. She was given IVFs and po procardia. She continued to have painful ctxs and was admitted. She had a BV infection. Her u/a appears contaminated. It was sent for culture. She has a h.o. A preterm birth at 4036 weeks secondary to PPROM. After admission she had an u/s which indicated that her cx was 3.2cm History reviewed. No pertinent past medical history.  History reviewed. No pertinent surgical history.  History reviewed. No pertinent family history. Social History:  reports that  has never smoked. she has never used smokeless tobacco. She reports that she drinks alcohol. She reports that she does not use drugs.  Allergies:  Allergies  Allergen Reactions  . Apple Itching    Tongue itches    Medications Prior to Admission  Medication Sig Dispense Refill  . acetaminophen (TYLENOL) 325 MG tablet Take 650 mg by mouth every 6 (six) hours as needed.    Marland Kitchen. guaifenesin (ROBITUSSIN) 100 MG/5ML syrup Take 200 mg by mouth 3 (three) times daily as needed for cough.    Marland Kitchen. ibuprofen (ADVIL,MOTRIN) 200 MG tablet Take 400 mg by mouth 2 (two) times daily as needed for cramping.    . loratadine (CLARITIN) 10 MG tablet Take 10 mg by mouth daily as needed for allergies.    . methylPREDNISolone (MEDROL DOSEPAK) 4 MG TBPK tablet follow package directions 21 tablet 0  . norethindrone-ethinyl estradiol (MICROGESTIN FE 1/20) 1-20 MG-MCG tablet Take 1 tablet by mouth at bedtime.         Blood pressure (!) 105/50, pulse (!) 122, temperature 98.2 F (36.8 C), temperature source Oral, resp. rate 18, height 5\' 6"  (1.676 m), weight 139 lb (63 kg), last menstrual period 01/14/2017, SpO2 100 %. General appearance: alert, cooperative and mild distress Back: negative Abdomen: gravid, non tender ,palp ctxs Cx- long/closed this am  Lab Results  Component Value Date   WBC 13.2 (H) 05/19/2016   HGB 13.4 05/19/2016   HCT 39.9 05/19/2016   MCV 87.3 05/19/2016   PLT 422 (H) 05/19/2016   No results found for: PREGTESTUR, PREGSERUM, HCG, HCGQUANT     Patient Active Problem List   Diagnosis Date Noted  . Preterm contractions 10/05/2017   IMP/ IUP at 21 4/7 wks         Painful ctxs not responding to IVFs/procardia Plan/ Admit          Start MgSO4          Tx BV          Check u/a culture            ANDERSON,MARK E 10/05/2017, 8:30 AM

## 2017-10-05 NOTE — Progress Notes (Signed)
Up to BR earlier

## 2017-10-05 NOTE — Progress Notes (Signed)
Pt was c/o painful ctxs, 8/10 . She was ctxing q 3-5 min. She was given SQ terb and ctxs stopped.  Will tx BV and follow.  Continue MgSO4

## 2017-10-05 NOTE — Progress Notes (Signed)
Transferred care to [place] LPV[place]ica Mensah RN.

## 2017-10-05 NOTE — Progress Notes (Signed)
toco reapplied after u/s while waiting for results

## 2017-10-06 ENCOUNTER — Other Ambulatory Visit: Payer: Self-pay

## 2017-10-06 LAB — GC/CHLAMYDIA PROBE AMP (~~LOC~~) NOT AT ARMC
CHLAMYDIA, DNA PROBE: NEGATIVE
Neisseria Gonorrhea: NEGATIVE

## 2017-10-06 LAB — CULTURE, OB URINE: CULTURE: NO GROWTH

## 2017-10-06 MED ORDER — HYDROXYPROGESTERONE CAPROATE 250 MG/ML IM OIL
250.0000 mg | TOPICAL_OIL | Freq: Once | INTRAMUSCULAR | Status: AC
  Administered 2017-10-06: 250 mg via INTRAMUSCULAR
  Filled 2017-10-06: qty 1

## 2017-10-06 MED ORDER — METRONIDAZOLE 500 MG PO TABS: 500.0000 mg | ORAL_TABLET | Freq: Two times a day (BID) | ORAL | 0 refills | Status: DC

## 2017-10-06 NOTE — Progress Notes (Signed)
Discharge instructions reviewed with patient including medication changes and antibiotic therapy .

## 2017-10-06 NOTE — Progress Notes (Signed)
Patient admitted over the weekend with painful contractions.  She reports cramping that started Saturday morning and progressed to painful contractions by the evening, so she presented to the MAU. She received IVF, po procardia in MAU with no improvement.  Cervix remained closed, but given significant pain, she was admitted for observation.  There was some initial confusion re: gestational age, so she was started on magnesium and given a dose of BMZ.  She also received 1 dose of [place] terbutaline.  Contractions stopped yesterday and magnesium was discontinued.    She had an US that showed a TVCL of 3.2 cm.  US also notes ? Gartner duct cyst measuring 2.9 x 2.3 cm.   She has a h/o PTB at 36 wks following PPROM.  Has not been on 17-P this pregnancy.    This AM, she is feeling well.  Denies any painful contractions since yesterday morning.  Denies vb/lof.  Reports FM.    BP 93/60 (BP Location: Left Arm)   Pulse 100   Temp 98.1 F (36.7 C) (Oral)   Resp 18   Ht 5\' 6"  (1.676 m)   Wt 63 kg (139 lb)   LMP 01/14/2017   SpO2 100%   BMI 22.44 kg/m  NAD Abd soft, gravid SVE: Closed/50/posterior   A&P: [id number] @ 3132w5d w preterm contractions Preterm contractions have resolved.  Given GA < 23 weeks, no indication for second dose of bmz or continued observation at this time Discussed role of 17-P in prevention of recurrent preterm birth.  As she is <24 weeks, would still be reasonable to initiate and patient agrees.  Will given first dose prior to discharge.  PTL precautions reviewed.

## 2017-10-06 NOTE — Progress Notes (Signed)
Dr. Dareen PianoAnderson called for clarification of BMZ orders.  No new orders at this time.

## 2017-10-07 NOTE — L&D Delivery Note (Signed)
Pt was admitted for an induction for AMA. She had two cytotecs placed. This am had AROM and pit started. Progressed along a nl labor curve. Pushed for 45 min. Had a SVD of one live viable infant over an intact perineum in the LOA position. Placenta S/I. EBL-400cc/ Baby to NBN. Pt desires PP BTL.

## 2017-10-13 NOTE — Discharge Summary (Signed)
Patient admitted over the weekend with painful contractions.  She reports cramping that started Saturday morning and progressed to painful contractions by the evening, so she presented to the MAU. She received IVF, po procardia in MAU with no improvement.  Cervix remained closed, but given significant pain, she was admitted for observation.  There was some initial confusion re: gestational age, so she was started on magnesium and given a dose of BMZ.  She also received 1 dose of [place] terbutaline.  Contractions stopped and magnesium was discontinued.  She had an US that showed a TVCL of 3.2 cm.  US also notes ? Gartner duct cyst measuring 2.9 x 2.3 cm.   Patient was then asymptomatic for ~ 24 hours.  Cervix was unchanged at 0/50/posterior.    She has a h/o PTB at 36 wks following PPROM.  The role of 17-P in prevention of recurrent preterm birth was discussed and initiated prior to discharge.   Discharge diagnosis: abdominal pain and preterm contractions Discharge meds:  Prenatal vitamins and metronidazole.   Discharge follow up:  1 week for routine OB visit and 17-P

## 2017-12-03 ENCOUNTER — Ambulatory Visit: Payer: BLUE CROSS/BLUE SHIELD

## 2018-01-12 LAB — OB RESULTS CONSOLE GBS: STREP GROUP B AG: NEGATIVE

## 2018-01-26 ENCOUNTER — Other Ambulatory Visit: Payer: Self-pay

## 2018-01-26 ENCOUNTER — Encounter (HOSPITAL_COMMUNITY): Payer: Self-pay

## 2018-01-26 ENCOUNTER — Inpatient Hospital Stay (HOSPITAL_COMMUNITY)
Admission: AD | Admit: 2018-01-26 | Discharge: 2018-01-26 | Disposition: A | Discharge status: Home / Self Care | Payer: BLUE CROSS/BLUE SHIELD | Source: Ambulatory Visit | Attending: Obstetrics and Gynecology | Admitting: Obstetrics and Gynecology

## 2018-01-26 DIAGNOSIS — Z3689 Encounter for other specified antenatal screening: Secondary | ICD-10-CM

## 2018-01-26 DIAGNOSIS — O09523 Supervision of elderly multigravida, third trimester: Secondary | ICD-10-CM | POA: Diagnosis not present

## 2018-01-26 DIAGNOSIS — Z3A37 37 weeks gestation of pregnancy: Secondary | ICD-10-CM | POA: Diagnosis not present

## 2018-01-26 DIAGNOSIS — Z91018 Allergy to other foods: Secondary | ICD-10-CM | POA: Diagnosis not present

## 2018-01-26 HISTORY — DX: Major depressive disorder, single episode, unspecified: F32.9

## 2018-01-26 HISTORY — DX: Anxiety disorder, unspecified: F41.9

## 2018-01-26 HISTORY — DX: Depression, unspecified: F32.A

## 2018-01-26 NOTE — MAU Provider Note (Signed)
Chief Complaint:  No chief complaint on file.   First Provider Initiated Contact with Patient 01/26/18 1740      HPI: Sara Burns is a 37 y.o. [id number] at [redacted]w[redacted]d who presents to maternity admissions sent from the office for a nonreactive NST. She reports she is monitored in the office due to advanced maternal age and reports that something was wrong with her tracing today so they sent her to MAU. She denies any abdominal pain, vaginal bleeding, or leakage of fluid. She is feeling normal fetal movement. She has not tried any treatments and there are no associated symptoms.  HPI  Past Medical History: Past Medical History:  Diagnosis Date  . Anxiety   . Depression     Past obstetric history: OB History  Gravida Para Term Preterm AB Living  3 1   1 1 1   SAB TAB Ectopic Multiple Live Births  1       1    # Outcome Date GA Lbr Len/2nd Weight Sex Delivery Anes PTL Lv  3 Current           2 Preterm 02/22/14     Vag-Spont   LIV  1 SAB             Past Surgical History: Past Surgical History:  Procedure Laterality Date  . NO PAST SURGERIES    . WISDOM TOOTH EXTRACTION      Family History: No family history on file.  Social History: Social History   Tobacco Use  . Smoking status: Never Smoker  . Smokeless tobacco: Never Used  Substance Use Topics  . Alcohol use: Yes    Comment: occasion  . Drug use: No    Allergies:  Allergies  Allergen Reactions  . Apple Itching    Tongue itches    Meds:  Medications Prior to Admission  Medication Sig Dispense Refill Last Dose  . acetaminophen (TYLENOL) 325 MG tablet Take 650 mg by mouth every 6 (six) hours as needed for mild pain or headache.    Past Week at Unknown time  . guaifenesin (ROBITUSSIN) 100 MG/5ML syrup Take 200 mg by mouth 3 (three) times daily as needed for cough.   Past Week at Unknown time  . metroNIDAZOLE (FLAGYL) 500 MG tablet Take 1 tablet (500 mg total) by mouth every 12 (twelve) hours. 12 tablet 0    . Prenatal Vit-Fe Fumarate-FA (PRENATAL MULTIVITAMIN) TABS tablet Take 1 tablet by mouth daily at 12 noon.   10/04/2017 at Unknown time    ROS:  Review of Systems  Constitutional: Negative for chills, fatigue and fever.  Respiratory: Negative for shortness of breath.   Cardiovascular: Negative for chest pain.  Gastrointestinal: Negative for abdominal pain, nausea and vomiting.  Genitourinary: Negative for difficulty urinating, dysuria, flank pain, pelvic pain, vaginal bleeding, vaginal discharge and vaginal pain.  Neurological: Negative for dizziness and headaches.  Psychiatric/Behavioral: Negative.      I have reviewed patient's Past Medical Hx, Surgical Hx, Family Hx, Social Hx, medications and allergies.   Physical Exam   Patient Vitals for the past 24 hrs:  BP Temp Temp src Pulse Resp SpO2 Height Weight  01/26/18 1800 107/70 98.3 F (36.8 C) Oral 88 16 - - -  01/26/18 1705 - - - - - 100 % 5\' 6"  (1.676 m) 155 lb 12 oz (70.6 kg)  01/26/18 1700 118/70 98.4 F (36.9 C) Oral 91 18 - - -   Constitutional: Well-developed, well-nourished female in no acute distress.  Cardiovascular: normal rate Respiratory: normal effort GI: Abd soft, non-tender, gravid appropriate for gestational age.  MS: Extremities nontender, no edema, normal ROM Neurologic: Alert and oriented x 4.  GU: Neg CVAT.      FHT:  Baseline 145 , moderate variability, accelerations present, no decelerations Contractions: 1 in 1 hour, mild to palpation   Labs: No results found for this or any previous visit (from the past 24 hour(s)). --/--/B POS, B POS (12/30 0528)  Imaging:  No results found.  MAU Course/MDM: Pt in antenatal testing for advanced maternal age At routine NST today, she had possible late deceleration x 1 and was sent to MAU for further evaluation Consult Dr Claiborne Billingsallahan with assessment and findings NST reviewed and reactive x 1 hour D/C home Fetal movement counting/reasons to return  reviewed Pt discharge with strict return precautions.   Assessment: 1. NST (non-stress test) reactive     Plan: Discharge home Labor precautions and fetal kick counts Follow-up Information    [facility]   Why:  As scheduled, return to MAU as needed for signs of labor or emergencies. Contact information: [address] [place] [address] [phone]          Allergies as of 01/26/2018      Reactions   Apple Itching   Tongue itches      Medication List    STOP taking these medications   guaifenesin 100 MG/5ML syrup Commonly known as:  ROBITUSSIN   metroNIDAZOLE 500 MG tablet Commonly known as:  FLAGYL     TAKE these medications   acetaminophen 325 MG tablet Commonly known as:  TYLENOL Take 650 mg by mouth every 6 (six) hours as needed for mild pain or headache.   prenatal multivitamin Tabs tablet Take 1 tablet by mouth daily at 12 noon.       Sharen CounterLisa Leftwich-Kirby Certified Nurse-Midwife 01/26/2018 6:23 PM

## 2018-01-26 NOTE — MAU Note (Signed)
Pt states she was sent from ROV to MAU for additional fetal monitoring. Denies vag bleeding or LOF.  Endorses +FM.

## 2018-02-04 ENCOUNTER — Other Ambulatory Visit: Payer: Self-pay | Admitting: Obstetrics and Gynecology

## 2018-02-04 ENCOUNTER — Telehealth (HOSPITAL_COMMUNITY): Payer: Self-pay | Admitting: *Deleted

## 2018-02-04 NOTE — Telephone Encounter (Signed)
Preadmission screen  

## 2018-02-05 ENCOUNTER — Encounter (HOSPITAL_COMMUNITY): Payer: Self-pay

## 2018-02-05 ENCOUNTER — Inpatient Hospital Stay (HOSPITAL_COMMUNITY)
Admission: RE | Admit: 2018-02-05 | Discharge: 2018-02-07 | DRG: 798 | Disposition: A | Discharge status: Home / Self Care | Payer: BLUE CROSS/BLUE SHIELD | Source: Ambulatory Visit | Attending: Obstetrics and Gynecology | Admitting: Obstetrics and Gynecology

## 2018-02-05 ENCOUNTER — Inpatient Hospital Stay (HOSPITAL_COMMUNITY): Payer: BLUE CROSS/BLUE SHIELD | Admitting: Anesthesiology

## 2018-02-05 VITALS — BP 104/71 | HR 92 | Temp 98.3°F | Resp 20 | Ht 66.0 in | Wt 156.5 lb

## 2018-02-05 DIAGNOSIS — Z3A39 39 weeks gestation of pregnancy: Secondary | ICD-10-CM | POA: Diagnosis not present

## 2018-02-05 DIAGNOSIS — O26893 Other specified pregnancy related conditions, third trimester: Secondary | ICD-10-CM | POA: Diagnosis present

## 2018-02-05 DIAGNOSIS — Z3490 Encounter for supervision of normal pregnancy, unspecified, unspecified trimester: Secondary | ICD-10-CM

## 2018-02-05 DIAGNOSIS — Z302 Encounter for sterilization: Secondary | ICD-10-CM

## 2018-02-05 LAB — TYPE AND SCREEN
ABO/RH(D): B POS
ANTIBODY SCREEN: NEGATIVE

## 2018-02-05 LAB — CBC
HEMATOCRIT: 31.8 % — AB (ref 36.0–46.0)
HEMOGLOBIN: 10.5 g/dL — AB (ref 12.0–15.0)
MCH: 28.1 pg (ref 26.0–34.0)
MCHC: 33 g/dL (ref 30.0–36.0)
MCV: 85 fL (ref 78.0–100.0)
Platelets: 254 10*3/uL (ref 150–400)
RBC: 3.74 MIL/uL — ABNORMAL LOW (ref 3.87–5.11)
RDW: 16.8 % — ABNORMAL HIGH (ref 11.5–15.5)
WBC: 8.7 10*3/uL (ref 4.0–10.5)

## 2018-02-05 LAB — RPR: RPR Ser Ql: NONREACTIVE

## 2018-02-05 MED ORDER — FENTANYL 2.5 MCG/ML BUPIVACAINE 1/10 % EPIDURAL INFUSION (WH - ANES)
14.0000 mL/h | INTRAMUSCULAR | Status: DC | PRN
  Administered 2018-02-05: 14 mL/h via EPIDURAL

## 2018-02-05 MED ORDER — MISOPROSTOL 200 MCG PO TABS
800.0000 ug | ORAL_TABLET | Freq: Once | ORAL | Status: AC
  Administered 2018-02-05: 800 ug via RECTAL

## 2018-02-05 MED ORDER — OXYCODONE-ACETAMINOPHEN 5-325 MG PO TABS: 1.0000 | ORAL_TABLET | ORAL | Status: DC | PRN

## 2018-02-05 MED ORDER — FLEET ENEMA 7-19 GM/118ML RE ENEM: 1.0000 | ENEMA | RECTAL | Status: DC | PRN

## 2018-02-05 MED ORDER — WITCH HAZEL-GLYCERIN EX PADS: 1.0000 "application " | MEDICATED_PAD | CUTANEOUS | Status: DC | PRN

## 2018-02-05 MED ORDER — SIMETHICONE 80 MG PO CHEW: 80.0000 mg | CHEWABLE_TABLET | ORAL | Status: DC | PRN

## 2018-02-05 MED ORDER — ONDANSETRON HCL 4 MG PO TABS: 4.0000 mg | ORAL_TABLET | ORAL | Status: DC | PRN

## 2018-02-05 MED ORDER — LIDOCAINE HCL (PF) 1 % IJ SOLN
30.0000 mL | INTRAMUSCULAR | Status: DC | PRN
  Filled 2018-02-05: qty 30

## 2018-02-05 MED ORDER — EPHEDRINE 5 MG/ML INJ
10.0000 mg | INTRAVENOUS | Status: DC | PRN
  Filled 2018-02-05: qty 2

## 2018-02-05 MED ORDER — MEASLES, MUMPS & RUBELLA VAC ~~LOC~~ INJ
0.5000 mL | INJECTION | Freq: Once | SUBCUTANEOUS | Status: DC
  Filled 2018-02-05: qty 0.5

## 2018-02-05 MED ORDER — OXYTOCIN 40 UNITS IN LACTATED RINGERS INFUSION - SIMPLE MED
1.0000 m[IU]/min | INTRAVENOUS | Status: DC
  Administered 2018-02-05: 2 m[IU]/min via INTRAVENOUS

## 2018-02-05 MED ORDER — METHYLERGONOVINE MALEATE 0.2 MG/ML IJ SOLN
0.2000 mg | Freq: Once | INTRAMUSCULAR | Status: AC
  Administered 2018-02-05: 0.2 mg via INTRAMUSCULAR
  Filled 2018-02-05: qty 1

## 2018-02-05 MED ORDER — MISOPROSTOL 200 MCG PO TABS
ORAL_TABLET | ORAL | Status: AC
  Administered 2018-02-05: 800 ug via RECTAL
  Filled 2018-02-05: qty 4

## 2018-02-05 MED ORDER — ZOLPIDEM TARTRATE 5 MG PO TABS: 5.0000 mg | ORAL_TABLET | Freq: Every evening | ORAL | Status: DC | PRN

## 2018-02-05 MED ORDER — COCONUT OIL OIL: 1.0000 "application " | TOPICAL_OIL | Status: DC | PRN

## 2018-02-05 MED ORDER — ACETAMINOPHEN 325 MG PO TABS
650.0000 mg | ORAL_TABLET | ORAL | Status: DC | PRN
  Administered 2018-02-06 (×2): 650 mg via ORAL
  Filled 2018-02-05 (×2): qty 2

## 2018-02-05 MED ORDER — TETANUS-DIPHTH-ACELL PERTUSSIS 5-2.5-18.5 LF-MCG/0.5 IM SUSP: 0.5000 mL | Freq: Once | INTRAMUSCULAR | Status: DC

## 2018-02-05 MED ORDER — PHENYLEPHRINE 40 MCG/ML (10ML) SYRINGE FOR IV PUSH (FOR BLOOD PRESSURE SUPPORT)
80.0000 ug | PREFILLED_SYRINGE | INTRAVENOUS | Status: DC | PRN
  Filled 2018-02-05: qty 5

## 2018-02-05 MED ORDER — ONDANSETRON HCL 4 MG/2ML IJ SOLN: 4.0000 mg | Freq: Four times a day (QID) | INTRAMUSCULAR | Status: DC | PRN

## 2018-02-05 MED ORDER — LACTATED RINGERS IV SOLN
INTRAVENOUS | Status: DC
  Administered 2018-02-05 (×3): via INTRAVENOUS

## 2018-02-05 MED ORDER — OXYTOCIN 40 UNITS IN LACTATED RINGERS INFUSION - SIMPLE MED
2.5000 [IU]/h | INTRAVENOUS | Status: DC
  Filled 2018-02-05: qty 1000

## 2018-02-05 MED ORDER — OXYTOCIN BOLUS FROM INFUSION
500.0000 mL | Freq: Once | INTRAVENOUS | Status: AC
  Administered 2018-02-05: 500 mL via INTRAVENOUS

## 2018-02-05 MED ORDER — BENZOCAINE-MENTHOL 20-0.5 % EX AERO: 1.0000 "application " | INHALATION_SPRAY | CUTANEOUS | Status: DC | PRN

## 2018-02-05 MED ORDER — TERBUTALINE SULFATE 1 MG/ML IJ SOLN
0.2500 mg | Freq: Once | INTRAMUSCULAR | Status: DC | PRN
  Filled 2018-02-05: qty 1

## 2018-02-05 MED ORDER — IBUPROFEN 600 MG PO TABS
600.0000 mg | ORAL_TABLET | Freq: Four times a day (QID) | ORAL | Status: DC
  Administered 2018-02-06 – 2018-02-07 (×7): 600 mg via ORAL
  Filled 2018-02-05 (×6): qty 1

## 2018-02-05 MED ORDER — LACTATED RINGERS IV SOLN
500.0000 mL | Freq: Once | INTRAVENOUS | Status: AC
  Administered 2018-02-05: 500 mL via INTRAVENOUS

## 2018-02-05 MED ORDER — LACTATED RINGERS IV SOLN
500.0000 mL | INTRAVENOUS | Status: DC | PRN
  Administered 2018-02-05: 1000 mL via INTRAVENOUS
  Administered 2018-02-05: 500 mL via INTRAVENOUS

## 2018-02-05 MED ORDER — ONDANSETRON HCL 4 MG/2ML IJ SOLN: 4.0000 mg | INTRAMUSCULAR | Status: DC | PRN

## 2018-02-05 MED ORDER — ACETAMINOPHEN 325 MG PO TABS: 650.0000 mg | ORAL_TABLET | ORAL | Status: DC | PRN

## 2018-02-05 MED ORDER — EPHEDRINE 5 MG/ML INJ
10.0000 mg | INTRAVENOUS | Status: DC | PRN
Start: 2018-02-05 — End: 2018-02-06
  Filled 2018-02-05: qty 2

## 2018-02-05 MED ORDER — MISOPROSTOL 25 MCG QUARTER TABLET
25.0000 ug | ORAL_TABLET | ORAL | Status: DC | PRN
  Administered 2018-02-05 (×2): 25 ug via VAGINAL
  Filled 2018-02-05 (×3): qty 1

## 2018-02-05 MED ORDER — LIDOCAINE HCL (PF) 1 % IJ SOLN
INTRAMUSCULAR | Status: DC | PRN
  Administered 2018-02-05: 6 mL via EPIDURAL

## 2018-02-05 MED ORDER — DIBUCAINE 1 % RE OINT: 1.0000 "application " | TOPICAL_OINTMENT | RECTAL | Status: DC | PRN

## 2018-02-05 MED ORDER — LACTATED RINGERS IV SOLN: 500.0000 mL | Freq: Once | INTRAVENOUS | Status: DC

## 2018-02-05 MED ORDER — FENTANYL 2.5 MCG/ML BUPIVACAINE 1/10 % EPIDURAL INFUSION (WH - ANES)
INTRAMUSCULAR | Status: AC
  Filled 2018-02-05: qty 100

## 2018-02-05 MED ORDER — SENNOSIDES-DOCUSATE SODIUM 8.6-50 MG PO TABS
2.0000 | ORAL_TABLET | ORAL | Status: DC
  Administered 2018-02-06 (×2): 2 via ORAL
  Filled 2018-02-05: qty 2

## 2018-02-05 MED ORDER — DIPHENHYDRAMINE HCL 50 MG/ML IJ SOLN: 12.5000 mg | INTRAMUSCULAR | Status: DC | PRN

## 2018-02-05 MED ORDER — PHENYLEPHRINE 40 MCG/ML (10ML) SYRINGE FOR IV PUSH (FOR BLOOD PRESSURE SUPPORT)
PREFILLED_SYRINGE | INTRAVENOUS | Status: AC
  Filled 2018-02-05: qty 20

## 2018-02-05 MED ORDER — SOD CITRATE-CITRIC ACID 500-334 MG/5ML PO SOLN: 30.0000 mL | ORAL | Status: DC | PRN

## 2018-02-05 MED ORDER — OXYCODONE-ACETAMINOPHEN 5-325 MG PO TABS: 2.0000 | ORAL_TABLET | ORAL | Status: DC | PRN

## 2018-02-05 NOTE — Anesthesia Pain Management Evaluation Note (Signed)
  CRNA Pain Management Visit Note  Patient: Sara Burns, 37 y.o., female  "Hello I am a member of the anesthesia team at [facility]. We have an anesthesia team available at all times to provide care throughout the hospital, including epidural management and anesthesia for C-section. I don't know your plan for the delivery whether it a natural birth, water birth, IV sedation, nitrous supplementation, doula or epidural, but we want to meet your pain goals."   1.Was your pain managed to your expectations on prior hospitalizations?   Yes   2.What is your expectation for pain management during this hospitalization?     Epidural  3.How can we help you reach that goal? epidural  Record the patient's initial score and the patient's pain goal.   Pain: 3  Pain Goal: 6 [facility] wants you to be able to say your pain was always managed very well.  Layann Bluett 02/05/2018

## 2018-02-05 NOTE — Anesthesia Preprocedure Evaluation (Signed)
Anesthesia Evaluation  Patient identified by MRN, date of birth, ID band Patient awake    Reviewed: Allergy & Precautions, NPO status , Patient's Chart, lab work & pertinent test results  Airway Mallampati: II  TM Distance: >3 FB Neck ROM: Full    Dental no notable dental hx.    Pulmonary neg pulmonary ROS,    Pulmonary exam normal breath sounds clear to auscultation       Cardiovascular Exercise Tolerance: Good negative cardio ROS Normal cardiovascular exam Rhythm:Regular Rate:Normal     Neuro/Psych negative neurological ROS  negative psych ROS   GI/Hepatic negative GI ROS, Neg liver ROS,   Endo/Other  negative endocrine ROS  Renal/GU negative Renal ROS  negative genitourinary   Musculoskeletal negative musculoskeletal ROS (+)   Abdominal   Peds negative pediatric ROS (+)  Hematology negative hematology ROS (+)   Anesthesia Other Findings   Reproductive/Obstetrics (+) Pregnancy                             Lab Results  Component Value Date   WBC 8.7 02/05/2018   HGB 10.5 (L) 02/05/2018   HCT 31.8 (L) 02/05/2018   MCV 85.0 02/05/2018   PLT 254 02/05/2018    Anesthesia Physical Anesthesia Plan  ASA: II  Anesthesia Plan: Epidural   Post-op Pain Management:    Induction:   PONV Risk Score and Plan:   Airway Management Planned:   Additional Equipment:   Intra-op Plan:   Post-operative Plan:   Informed Consent: I have reviewed the patients History and Physical, chart, labs and discussed the procedure including the risks, benefits and alternatives for the proposed anesthesia with the patient or authorized representative who has indicated his/her understanding and acceptance.     Plan Discussed with:   Anesthesia Plan Comments:         Anesthesia Quick Evaluation

## 2018-02-05 NOTE — Anesthesia Procedure Notes (Signed)
Epidural Patient location during procedure: OB Start time: 02/05/2018 11:57 AM End time: 02/05/2018 12:10 PM  Staffing Anesthesiologist: Trevor Iha, MD Performed: anesthesiologist   Preanesthetic Checklist Completed: patient identified, site marked, surgical consent, pre-op evaluation, timeout performed, IV checked, risks and benefits discussed and monitors and equipment checked  Epidural Patient position: sitting Prep: site prepped and draped and DuraPrep Patient monitoring: continuous pulse ox and blood pressure Approach: midline Location: L3-L4 Injection technique: LOR air  Needle:  Needle type: Tuohy  Needle gauge: 17 G Needle length: 9 cm and 9 Needle insertion depth: 5 cm cm Catheter type: closed end flexible Catheter size: 19 Gauge Catheter at skin depth: 12 cm Test dose: negative  Assessment Events: blood not aspirated, injection not painful, no injection resistance, negative IV test and no paresthesia

## 2018-02-05 NOTE — H&P (Signed)
37 y.o. [redacted]w[redacted]d  [id number] comes in for induction for AMA.  Otherwise has good fetal movement and no bleeding.  Past Medical History:  Diagnosis Date  . Anxiety   . Depression     Past Surgical History:  Procedure Laterality Date  . NO PAST SURGERIES    . WISDOM TOOTH EXTRACTION      OB History  Gravida Para Term Preterm AB Living  SAB TAB Ectopic Multiple Live Births  1       1    # Outcome Date GA Lbr Len/2nd Weight Sex Delivery Anes PTL Lv  3 Current           2 Preterm 02/22/14     Vag-Spont   LIV  1 SAB             Social History   Socioeconomic History  . Marital status: Married    Spouse name: Not on file  . Number of children: Not on file  . Years of education: Not on file  . Highest education level: Not on file  Occupational History  . Not on file  Social Needs  . Financial resource strain: Not on file  . Food insecurity:    Worry: Not on file    Inability: Not on file  . Transportation needs:    Medical: Not on file    Non-medical: Not on file  Tobacco Use  . Smoking status: Never Smoker  . Smokeless tobacco: Never Used  Substance and Sexual Activity  . Alcohol use: Yes    Comment: occasion  . Drug use: No  . Sexual activity: Not on file  Lifestyle  . Physical activity:    Days per week: Not on file    Minutes per session: Not on file  . Stress: Not on file  Relationships  . Social connections:    Talks on phone: Not on file    Gets together: Not on file    Attends religious service: Not on file    Active member of club or organization: Not on file    Attends meetings of clubs or organizations: Not on file    Relationship status: Not on file  . Intimate partner violence:    Fear of current or ex partner: Not on file    Emotionally abused: Not on file    Physically abused: Not on file    Forced sexual activity: Not on file  Other Topics Concern  . Not on file  Social History Narrative  . Not on file   Apple    Prenatal  Transfer Tool  Maternal Diabetes: No Genetic Screening: Normal Maternal Ultrasounds/Referrals: Normal Fetal Ultrasounds or other Referrals:  None Maternal Substance Abuse:  No Significant Maternal Medications:  None Significant Maternal Lab Results: None  Other PNC: uncomplicated.  Pt's first baby may have had pyloric stenosis- had GI operation within days of birth.    Vitals:   02/05/18 0058  BP: 111/62  Pulse: 93  Resp: 16  Temp: 98 F (36.7 C)  TempSrc: Oral  Weight: 156 lb 8 oz (71 kg)  Height:  (1.676 m)    Lungs/Cor:  NAD Abdomen:  soft, gravid Ex:  no cords, erythema SVE:  1/th/-3 FHTs:  130, good STV, NST R Toco:  q 10   A/P   AMA term induction.    GBS neg.    Leylany Nored A

## 2018-02-06 ENCOUNTER — Other Ambulatory Visit: Payer: Self-pay

## 2018-02-06 ENCOUNTER — Inpatient Hospital Stay (HOSPITAL_COMMUNITY): Payer: BLUE CROSS/BLUE SHIELD | Admitting: Anesthesiology

## 2018-02-06 ENCOUNTER — Encounter (HOSPITAL_COMMUNITY): Payer: Self-pay

## 2018-02-06 ENCOUNTER — Encounter (HOSPITAL_COMMUNITY)
Admission: RE | Disposition: A | Discharge status: Home / Self Care | Payer: Self-pay | Source: Ambulatory Visit | Attending: Obstetrics and Gynecology

## 2018-02-06 HISTORY — PX: TUBAL LIGATION: SHX77

## 2018-02-06 LAB — CBC
HCT: 25.9 % — ABNORMAL LOW (ref 36.0–46.0)
Hemoglobin: 8.7 g/dL — ABNORMAL LOW (ref 12.0–15.0)
MCH: 28.7 pg (ref 26.0–34.0)
MCHC: 33.6 g/dL (ref 30.0–36.0)
MCV: 85.5 fL (ref 78.0–100.0)
PLATELETS: 197 10*3/uL (ref 150–400)
RBC: 3.03 MIL/uL — ABNORMAL LOW (ref 3.87–5.11)
RDW: 16.9 % — AB (ref 11.5–15.5)
WBC: 12.3 10*3/uL — ABNORMAL HIGH (ref 4.0–10.5)

## 2018-02-06 SURGERY — LIGATION, FALLOPIAN TUBE, POSTPARTUM
Anesthesia: Epidural | Laterality: Bilateral | Wound class: Clean

## 2018-02-06 MED ORDER — METOCLOPRAMIDE HCL 10 MG PO TABS
10.0000 mg | ORAL_TABLET | Freq: Once | ORAL | Status: AC
Start: 2018-02-06 — End: 2018-02-06
  Administered 2018-02-06: 10 mg via ORAL
  Filled 2018-02-06: qty 1

## 2018-02-06 MED ORDER — FENTANYL CITRATE (PF) 100 MCG/2ML IJ SOLN
INTRAMUSCULAR | Status: DC | PRN
  Administered 2018-02-06 (×4): 25 ug via INTRAVENOUS

## 2018-02-06 MED ORDER — SODIUM BICARBONATE 8.4 % IV SOLN
INTRAVENOUS | Status: AC
  Filled 2018-02-06: qty 50

## 2018-02-06 MED ORDER — SODIUM BICARBONATE 8.4 % IV SOLN
INTRAVENOUS | Status: DC | PRN
  Administered 2018-02-06 (×2): 5 mL via EPIDURAL

## 2018-02-06 MED ORDER — ONDANSETRON HCL 4 MG/2ML IJ SOLN
INTRAMUSCULAR | Status: AC
  Filled 2018-02-06: qty 2

## 2018-02-06 MED ORDER — FENTANYL CITRATE (PF) 250 MCG/5ML IJ SOLN
INTRAMUSCULAR | Status: AC
  Filled 2018-02-06: qty 5

## 2018-02-06 MED ORDER — BUPIVACAINE HCL (PF) 0.25 % IJ SOLN
INTRAMUSCULAR | Status: AC
  Filled 2018-02-06: qty 30

## 2018-02-06 MED ORDER — MIDAZOLAM HCL 2 MG/2ML IJ SOLN
INTRAMUSCULAR | Status: AC
  Filled 2018-02-06: qty 2

## 2018-02-06 MED ORDER — DEXAMETHASONE SODIUM PHOSPHATE 4 MG/ML IJ SOLN
INTRAMUSCULAR | Status: AC
  Filled 2018-02-06: qty 1

## 2018-02-06 MED ORDER — LACTATED RINGERS IV SOLN
INTRAVENOUS | Status: DC | PRN
  Administered 2018-02-06: 12:00:00 via INTRAVENOUS

## 2018-02-06 MED ORDER — KETOROLAC TROMETHAMINE 30 MG/ML IJ SOLN
INTRAMUSCULAR | Status: AC
Start: 2018-02-06 — End: 2018-02-06
  Filled 2018-02-06: qty 1

## 2018-02-06 MED ORDER — LACTATED RINGERS IV SOLN
INTRAVENOUS | Status: DC
  Administered 2018-02-06: 08:00:00 via INTRAVENOUS

## 2018-02-06 MED ORDER — ONDANSETRON HCL 4 MG/2ML IJ SOLN
INTRAMUSCULAR | Status: DC | PRN
  Administered 2018-02-06: 4 mg via INTRAVENOUS

## 2018-02-06 MED ORDER — FAMOTIDINE 20 MG PO TABS
40.0000 mg | ORAL_TABLET | Freq: Once | ORAL | Status: AC
  Administered 2018-02-06: 40 mg via ORAL
  Filled 2018-02-06: qty 2

## 2018-02-06 MED ORDER — KETOROLAC TROMETHAMINE 30 MG/ML IJ SOLN
INTRAMUSCULAR | Status: DC | PRN
  Administered 2018-02-06: 30 mg via INTRAVENOUS

## 2018-02-06 MED ORDER — MIDAZOLAM HCL 5 MG/5ML IJ SOLN
INTRAMUSCULAR | Status: DC | PRN
  Administered 2018-02-06 (×2): 1 mg via INTRAVENOUS

## 2018-02-06 MED ORDER — KETOROLAC TROMETHAMINE 30 MG/ML IJ SOLN
INTRAMUSCULAR | Status: AC
  Filled 2018-02-06: qty 1

## 2018-02-06 MED ORDER — HYDROMORPHONE HCL 1 MG/ML IJ SOLN: 0.2500 mg | INTRAMUSCULAR | Status: DC | PRN

## 2018-02-06 MED ORDER — BUPIVACAINE HCL (PF) 0.25 % IJ SOLN
INTRAMUSCULAR | Status: DC | PRN
  Administered 2018-02-06 (×2): 10 mL

## 2018-02-06 SURGICAL SUPPLY — 27 items
CLIP FILSHIE TUBAL LIGA STRL (Clip) ×3 IMPLANT
CLOTH BEACON ORANGE TIMEOUT ST (SAFETY) ×3 IMPLANT
DERMABOND ADHESIVE PROPEN (GAUZE/BANDAGES/DRESSINGS) ×2
DERMABOND ADVANCED (GAUZE/BANDAGES/DRESSINGS) ×2
DERMABOND ADVANCED .7 DNX12 (GAUZE/BANDAGES/DRESSINGS) ×1 IMPLANT
DERMABOND ADVANCED .7 DNX6 (GAUZE/BANDAGES/DRESSINGS) ×1 IMPLANT
DRSG OPSITE POSTOP 3X4 (GAUZE/BANDAGES/DRESSINGS) ×3 IMPLANT
DURAPREP 26ML APPLICATOR (WOUND CARE) ×3 IMPLANT
ELECT REM PT RETURN 9FT ADLT (ELECTROSURGICAL)
ELECTRODE REM PT RTRN 9FT ADLT (ELECTROSURGICAL) IMPLANT
GLOVE BIO SURGEON STRL SZ7 (GLOVE) ×3 IMPLANT
GLOVE BIOGEL PI IND STRL 7.0 (GLOVE) ×1 IMPLANT
GLOVE BIOGEL PI INDICATOR 7.0 (GLOVE) ×2
NEEDLE HYPO 22GX1.5 SAFETY (NEEDLE) ×3 IMPLANT
NS IRRIG 1000ML POUR BTL (IV SOLUTION) ×3 IMPLANT
PACK ABDOMINAL MINOR (CUSTOM PROCEDURE TRAY) ×3 IMPLANT
PENCIL BUTTON HOLSTER BLD 10FT (ELECTRODE) ×3 IMPLANT
PROTECTOR NERVE ULNAR (MISCELLANEOUS) ×3 IMPLANT
SPONGE LAP 4X18 X RAY DECT (DISPOSABLE) IMPLANT
SUT PLAIN 2 0 (SUTURE)
SUT PLAIN ABS 2-0 54XMFL TIE (SUTURE) IMPLANT
SUT VICRYL 0 UR6 27IN ABS (SUTURE) ×3 IMPLANT
SUT VICRYL 4-0 PS2 18IN ABS (SUTURE) ×3 IMPLANT
SYR CONTROL 10ML LL (SYRINGE) ×3 IMPLANT
TOWEL OR 17X24 6PK STRL BLUE (TOWEL DISPOSABLE) ×6 IMPLANT
TRAY FOLEY CATH SILVER 14FR (SET/KITS/TRAYS/PACK) ×3 IMPLANT
WATER STERILE IRR 1000ML POUR (IV SOLUTION) ×3 IMPLANT

## 2018-02-06 NOTE — Anesthesia Postprocedure Evaluation (Signed)
Anesthesia Post Note  Patient: Press photographer  Procedure(s) Performed: POST PARTUM TUBAL LIGATION (Bilateral )     Patient location during evaluation: PACU Anesthesia Type: Epidural Level of consciousness: awake and alert and oriented Pain management: pain level controlled Vital Signs Assessment: post-procedure vital signs reviewed and stable Respiratory status: spontaneous breathing, nonlabored ventilation and respiratory function stable Cardiovascular status: stable and blood pressure returned to baseline Postop Assessment: no headache, no backache, epidural receding, patient able to bend at knees and no apparent nausea or vomiting Anesthetic complications: no    Last Vitals:  Vitals:   02/06/18 1255 02/06/18 1300  BP:  102/60  Pulse: 75 76  Resp: 15 11  Temp:    SpO2: 96% 96%    Last Pain:  Vitals:   02/06/18 1250  TempSrc: Oral  PainSc: 0-No pain   Pain Goal:                 Praneeth Bussey A.

## 2018-02-06 NOTE — Addendum Note (Signed)
Addendum  created 02/06/18 1416 by Graciela Husbands, CRNA   Sign clinical note

## 2018-02-06 NOTE — Progress Notes (Signed)

## 2018-02-06 NOTE — Anesthesia Preprocedure Evaluation (Signed)
Anesthesia Evaluation  Patient identified by MRN, date of birth, ID band Patient awake    Reviewed: Allergy & Precautions, NPO status , Patient's Chart, lab work & pertinent test results  Airway Mallampati: II  TM Distance: >3 FB Neck ROM: Full    Dental no notable dental hx. (+) Teeth Intact   Pulmonary neg pulmonary ROS,    Pulmonary exam normal breath sounds clear to auscultation       Cardiovascular Exercise Tolerance: Good negative cardio ROS Normal cardiovascular exam Rhythm:Regular Rate:Normal     Neuro/Psych PSYCHIATRIC DISORDERS Anxiety Depression negative neurological ROS     GI/Hepatic negative GI ROS, Neg liver ROS,   Endo/Other  negative endocrine ROS  Renal/GU negative Renal ROS  negative genitourinary   Musculoskeletal negative musculoskeletal ROS (+)   Abdominal   Peds negative pediatric ROS (+)  Hematology negative hematology ROS (+)   Anesthesia Other Findings   Reproductive/Obstetrics Desires sterilization post partum                             Lab Results  Component Value Date   WBC 12.3 (H) 02/06/2018   HGB 8.7 (L) 02/06/2018   HCT 25.9 (L) 02/06/2018   MCV 85.5 02/06/2018   PLT 197 02/06/2018    Anesthesia Physical  Anesthesia Plan  ASA: II  Anesthesia Plan: Epidural   Post-op Pain Management:    Induction:   PONV Risk Score and Plan: 3 and Ondansetron, Treatment may vary due to age or medical condition and Dexamethasone  Airway Management Planned: Natural Airway, Nasal Cannula and Simple Face Mask  Additional Equipment:   Intra-op Plan:   Post-operative Plan:   Informed Consent: I have reviewed the patients History and Physical, chart, labs and discussed the procedure including the risks, benefits and alternatives for the proposed anesthesia with the patient or authorized representative who has indicated his/her understanding and acceptance.    Dental advisory given  Plan Discussed with: CRNA, Anesthesiologist and Surgeon  Anesthesia Plan Comments:         Anesthesia Quick Evaluation

## 2018-02-06 NOTE — Transfer of Care (Signed)
Immediate Anesthesia Transfer of Care Note  Patient: Sara Burns  Procedure(s) Performed: POST PARTUM TUBAL LIGATION (Bilateral )  Patient Location: PACU  Anesthesia Type:Epidural  Level of Consciousness: awake, alert  and oriented  Airway & Oxygen Therapy: Patient Spontanous Breathing  Post-op Assessment: Report given to RN and Post -op Vital signs reviewed and stable  Post vital signs: Reviewed  Last Vitals:  Vitals Value Taken Time  BP    Temp    Pulse 90 02/06/2018 12:50 PM  Resp 0 02/06/2018 12:50 PM  SpO2 96 % 02/06/2018 12:50 PM  Vitals shown include unvalidated device data.  Last Pain:  Vitals:   02/06/18 1129  TempSrc:   PainSc: 0-No pain         Complications: No apparent anesthesia complications

## 2018-02-06 NOTE — Plan of Care (Signed)
Patient is progressing well and doing great after BLT,has had a slight increase in pain that has resolved with meds

## 2018-02-06 NOTE — Progress Notes (Signed)
Post Partum Day 1 Subjective: no complaints, up ad lib, voiding, tolerating PO and + flatus  Objective: Blood pressure 107/61, pulse 72, temperature 98.3 F (36.8 C), temperature source Oral, resp. rate 16, height  (1.676 m), weight 71 kg (156 lb 8 oz), last menstrual period 01/14/2017, SpO2 99 %, unknown if currently breastfeeding.  Physical Exam:  General: alert, cooperative and appears stated age Lochia: appropriate Uterine Fundus: firm   Recent Labs    02/05/18 0111 02/06/18 0515  HGB 10.5* 8.7*  HCT 31.8* 25.9*    Assessment/Plan: Plan for discharge tomorrow and Breastfeeding  Desires PP BTL. R/B/A discussed with patient and she wishes to proceed   LOS: 1 day   Waynard Reeds 02/06/2018, 8:57 AM

## 2018-02-06 NOTE — Anesthesia Postprocedure Evaluation (Signed)
Anesthesia Post Note  Patient: Sara Burns  Procedure(s) Performed: POST PARTUM TUBAL LIGATION (Bilateral )     Patient location during evaluation: Mother Baby Anesthesia Type: Epidural Level of consciousness: awake and alert and oriented Pain management: satisfactory to patient Vital Signs Assessment: post-procedure vital signs reviewed and stable Respiratory status: respiratory function stable Cardiovascular status: stable Postop Assessment: no headache, no backache, epidural receding, patient able to bend at knees, no signs of nausea or vomiting and adequate PO intake Anesthetic complications: no    Last Vitals:  Vitals:   02/06/18 1330 02/06/18 1337  BP: 102/70 105/70  Pulse: 72 73  Resp: 18 18  Temp:  37.1 C  SpO2: 98% 98%    Last Pain: 5/10  Vitals:   02/06/18 1337  TempSrc: Oral  PainSc: 1    Pain Goal:  2   Gave patient TV control to call her nurse.  I also notified the unit secretary to repair the patient's bed control/call box and asked her to inform her nurse of the patient's request for pain medication.             Arica Bevilacqua

## 2018-02-06 NOTE — Anesthesia Postprocedure Evaluation (Signed)
Anesthesia Post Note  Patient: Press photographer  Procedure(s) Performed: AN AD HOC LABOR EPIDURAL     Patient location during evaluation: Mother Baby Anesthesia Type: Epidural Level of consciousness: awake and alert and oriented Pain management: satisfactory to patient Vital Signs Assessment: post-procedure vital signs reviewed and stable Respiratory status: respiratory function stable Cardiovascular status: stable Postop Assessment: no headache, no backache, epidural receding, patient able to bend at knees, no signs of nausea or vomiting and adequate PO intake Anesthetic complications: no    Last Vitals:  Vitals:   02/06/18 0005 02/06/18 0402  BP: 114/66 107/61  Pulse: 97 72  Resp: 17 16  Temp:    SpO2: 99%     Last Pain:  Vitals:   02/06/18 0710  TempSrc:   PainSc: 0-No pain   Pain Goal:                 [name]

## 2018-02-06 NOTE — Op Note (Signed)
Pre-Operative Diagnosis: 1) desired permanent sterilization Postoperative Diagnosis: Same Procedure: Postpartum tubal ligation Surgeon: Dr. Waynard Reeds Assistant:None Anesthesia: Epidural and 20 cc of 0.25% Marcaine injected infraumbilically. Operative Findings Normal appearing tube on left, edematous tube on right Specimen: right tubal segment JYN:[id number]  Procedure: Ms. Noell is an 37 year old gravida 3 para 2113 who presents for desired permanent sterilization. Risks benefits and alternatives of the procedure were discussed at length with the patient and she wishes to proceed. Following the appropriate informed consent the patient was taken to the operating room. She was placed in dorsal lithotomy position, and epidural anesthesia was administered. The abdomen, perineum, and vagina, were prepped in the normal sterile fashion. 10 cc of 0.25% Marcaine was injected infraumbilically. Alice clamps were used to elevate the skin and a 15 blade scalpel was used to make a semilunar skin incision. A hemostat was used to dissect down to the level of the peritoneum. The peritoneum was entered bluntly and abdominal entry was confirmed by direct visualization of abdominal contents. The patient was tilted to the left and the left fallopian tube was identified, grasped with a babcock, and followed out to the fimbriated end. A clear space in the mesosalpinx and a filshie clip was applied. The clip was seen to completely encircle the tube. The tube was returned to the abdomenal cavity. The patient was then airplaned to the contralateral side. The fallopian tube was identified, grasped with a babcock clamp, and followed out to the fimbrated end. The right tube was quite edematous, therefore a park[place] tubal ligation was performed on the tight tube and the right tubal segment was transected and removed. The site was noted to be hemostatic and the tube was returned to the abdominal cavity. The fascia was closed  with 2-0 vicryl with a figure of 8 suture. The skin was closed with 4-0 vicryl in a subcuticular fashion and dermabond. An additional 10 cc of 0.25% Marcaine was injected infraumbilically. All counts were correct at the completion of the procedure. The patient tolerated the procedure well and was transferred to PACU in stable condition following the procedure.

## 2018-02-07 ENCOUNTER — Encounter (HOSPITAL_COMMUNITY): Payer: Self-pay | Admitting: Obstetrics and Gynecology

## 2018-02-07 LAB — BIRTH TISSUE RECOVERY COLLECTION (PLACENTA DONATION)

## 2018-02-07 MED ORDER — OXYCODONE-ACETAMINOPHEN 5-325 MG PO TABS: 1.0000 | ORAL_TABLET | Freq: Four times a day (QID) | ORAL | 0 refills | Status: AC | PRN

## 2018-02-07 MED ORDER — DOCUSATE SODIUM 100 MG PO CAPS: 100.0000 mg | ORAL_CAPSULE | Freq: Two times a day (BID) | ORAL | 0 refills | Status: AC

## 2018-02-07 MED ORDER — IBUPROFEN 600 MG PO TABS: 600.0000 mg | ORAL_TABLET | Freq: Four times a day (QID) | ORAL | 0 refills | Status: AC | PRN

## 2018-02-07 NOTE — Discharge Summary (Signed)
Obstetric Discharge Summary Reason for Admission: onset of labor Prenatal Procedures: NST and ultrasound Intrapartum Procedures: spontaneous vaginal delivery Postpartum Procedures: P.P. tubal ligation Complications-Operative and Postpartum: none Hemoglobin  Date Value Ref Range Status  02/06/2018 8.7 (L) 12.0 - 15.0 g/dL Final   HCT  Date Value Ref Range Status  02/06/2018 25.9 (L) 36.0 - 46.0 % Final    Physical Exam:  General: alert, cooperative and appears stated age 89: appropriate Uterine Fundus: firm Incision: healing well DVT Evaluation: No evidence of DVT seen on physical exam.  Discharge Diagnoses: Term Pregnancy-delivered  Discharge Information: Date: 02/07/2018 Activity: pelvic rest Diet: routine Medications: Ibuprofen, Colace and Percocet Condition: improved Instructions: refer to practice specific booklet Discharge to: home Follow-up Information    [name], MD Follow up in 4 week(s).   Specialty:  Obstetrics and Gynecology Why:  For a postpartum evaluation Contact information: [address] [place] [address] [phone]           Newborn Data: Live born female  Birth Weight: 7 lb 3.9 oz (3285 g) APGAR: 8, 9  Newborn Delivery   Birth date/time:  02/05/2018 19:45:00 Delivery type:  Vaginal, Spontaneous     Home with mother.  Sara Burns 02/07/2018, 9:31 AM

## 2018-09-22 IMAGING — US US MFM OB TRANSVAGINAL
1 series · 15 of 20 positions shown · non-contrast
Comparison: none

ANAKX

NOMASIBULELE/Triage
1  JANORY CALIX           [id number]      [id number]     [id number]
Indications
21 weeks gestation of pregnancy
Preterm contractions
Encounter for cervical length
OB History
Gravidity:    3         Term:   0        Prem:   1        SAB:   1
TOP:          0       Ectopic:  0        Living: 1
Fetal Evaluation
Num Of Fetuses:     1
Fetal Heart         159
Rate(bpm):
Cardiac Activity:   Observed
Presentation:       Cephalic
Placenta:           Posterior, above cervical os
Amniotic Fluid
AFI FV:      Subjectively within normal limits
Gestational Age
Clinical EDD:  21w 4d                                        EDD:   02/11/18
Best:          21w 4d     Det. By:  Clinical EDD             EDD:   02/11/18
Cervix Uterus Adnexa
Cervix
Length:            3.2  cm.
Normal appearance by transvaginal scan
Uterus
? Gartner duct cyst noted measuring 2.9 x 2.3 x 0.9cm.
Impression
INDICATION: 36 yr old [id number] at 13w9d with abdominal pain
for cervical length. Remote read.

[Series 1: us mfm ob transvaginal · 20 acquisitions, 15 frames shown]
[im 1/20]
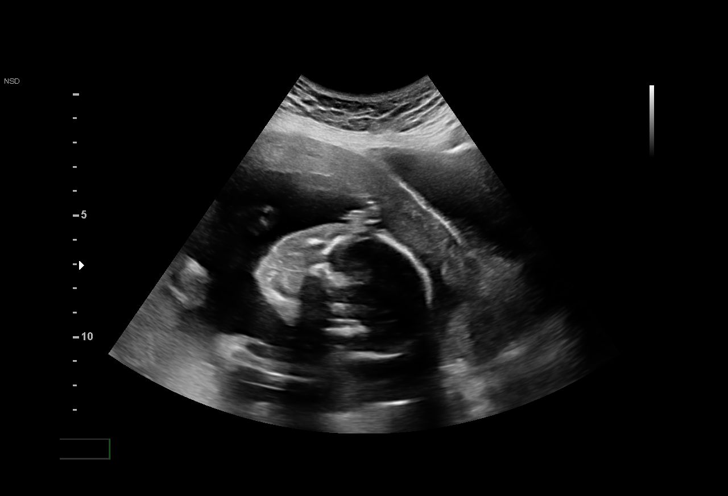
[im 3/20]
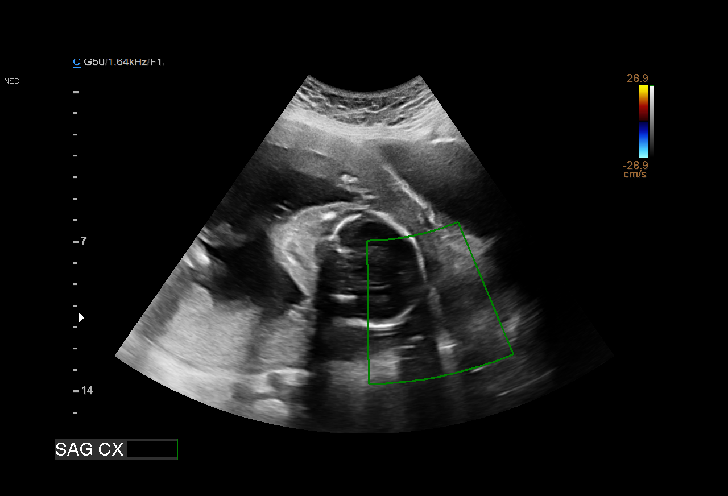
[im 4/20]
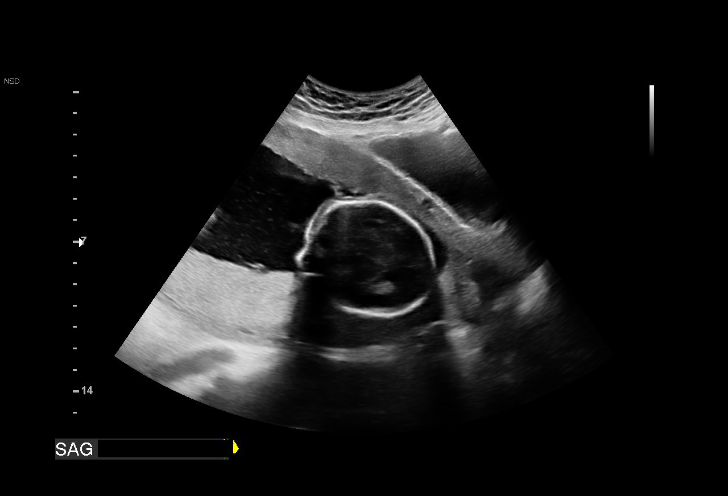
[im 5/20]
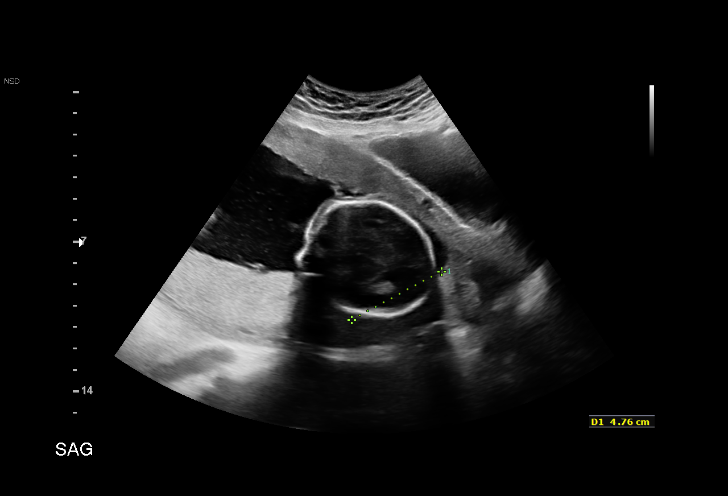
[im 7/20]
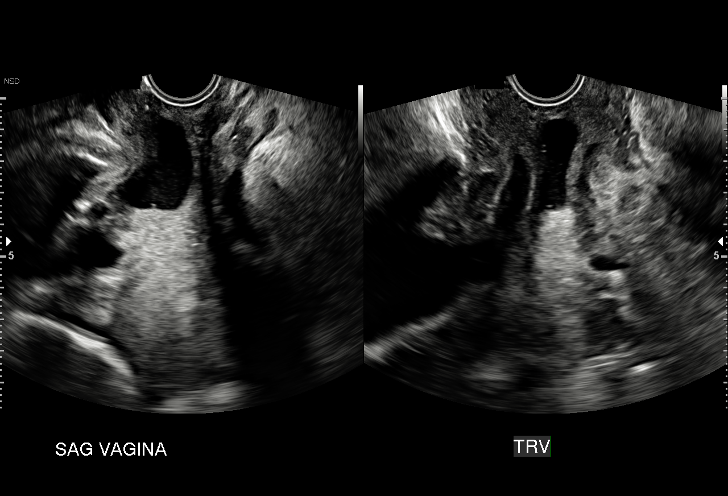
[im 8/20]
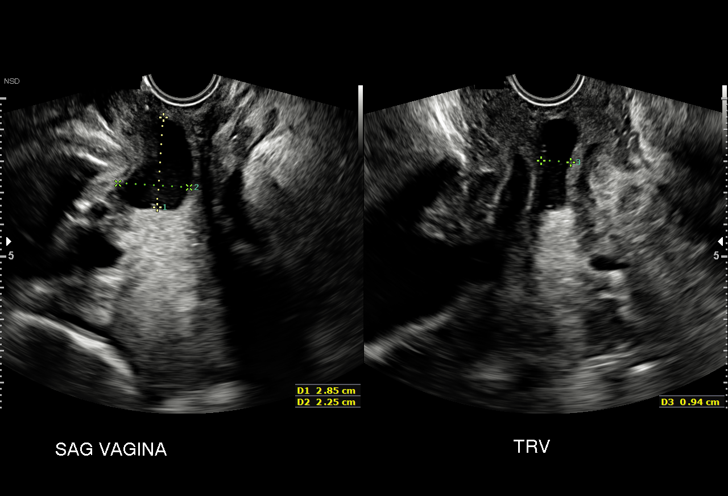
[im 9/20]
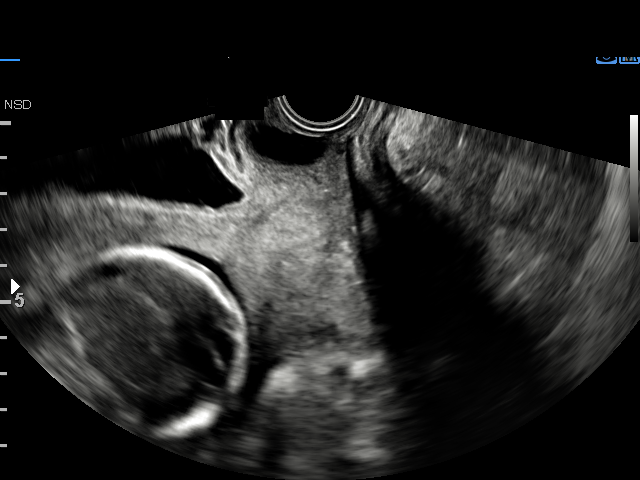
[im 11/20]
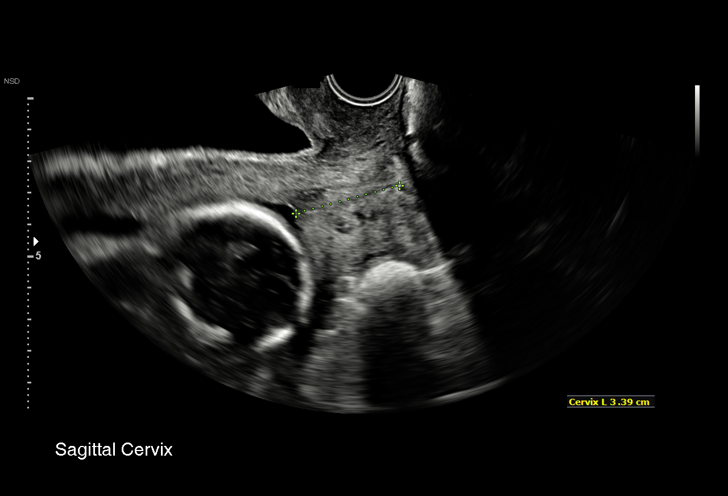
[im 12/20]
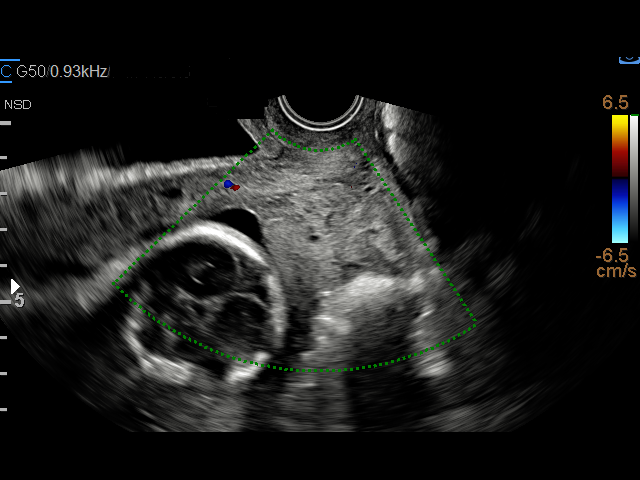
[im 13/20]
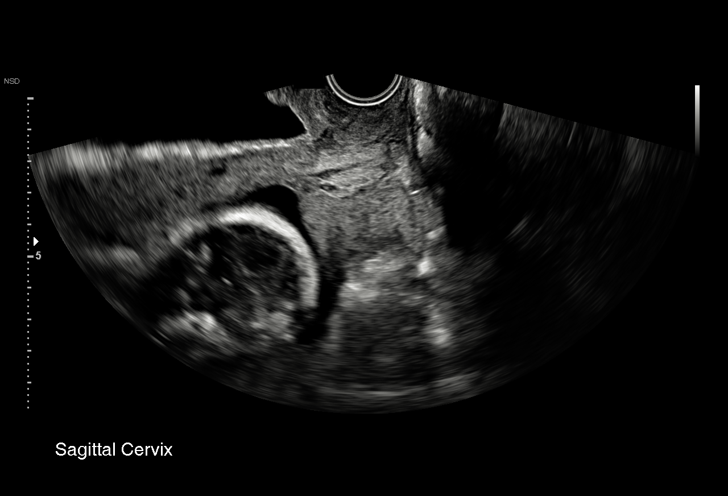
[im 15/20]
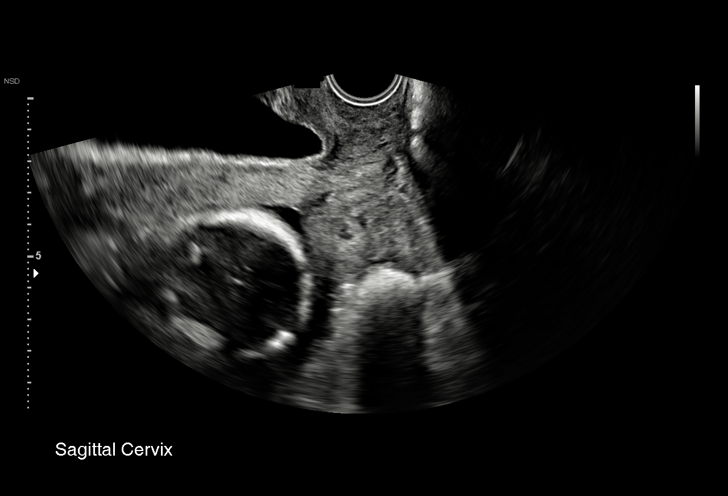
[im 16/20]
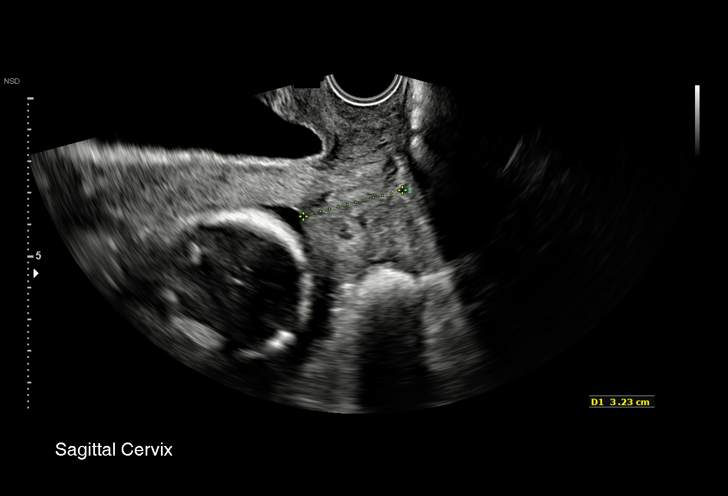
[im 17/20]
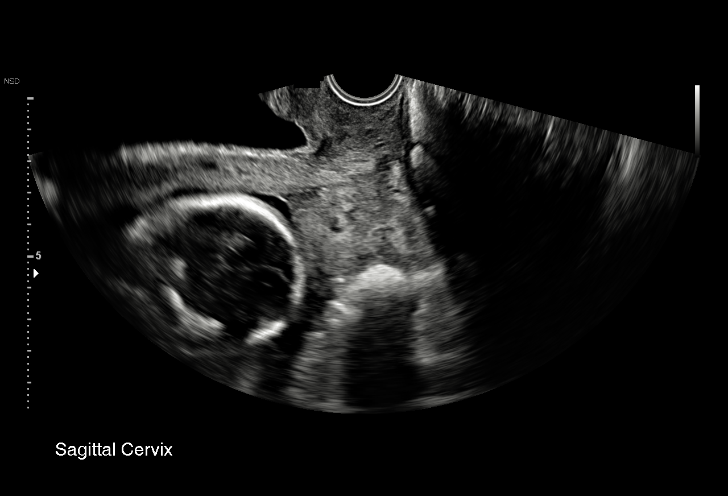
[im 19/20]
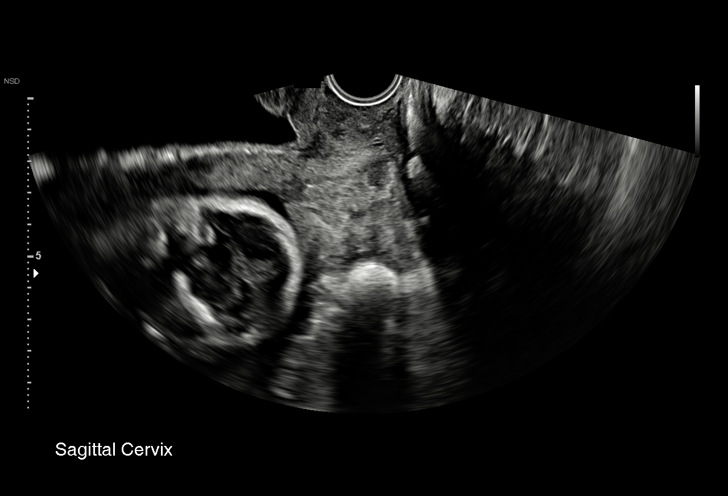
[im 20/20]
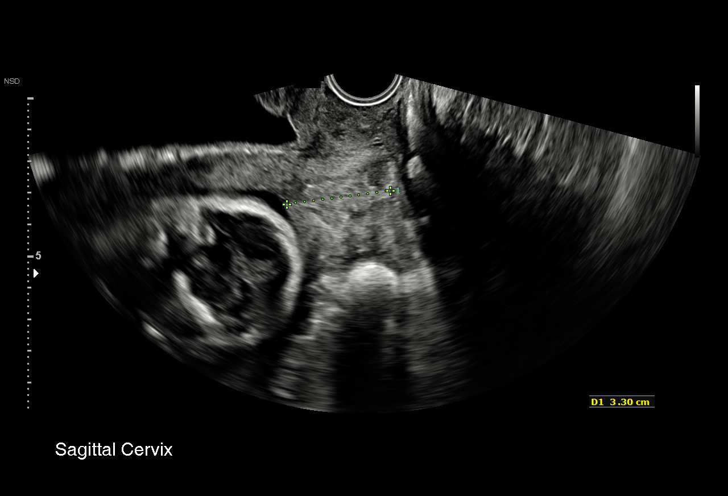

[15 of 20 positions shown; findings below may reference images not displayed]

FINDINGS: 1. Single intrauterine pregnancy.
2. Posterior placenta without evidence of previa.
4. Normal amniotic fluid volume.
5. Normal transvaginal cervical length.
Recommendations

1. Normal cervical length.
2. Management per primary OB.

## 2020-04-06 DIAGNOSIS — Z419 Encounter for procedure for purposes other than remedying health state, unspecified: Secondary | ICD-10-CM | POA: Diagnosis not present

## 2020-05-07 DIAGNOSIS — Z419 Encounter for procedure for purposes other than remedying health state, unspecified: Secondary | ICD-10-CM | POA: Diagnosis not present

## 2020-06-07 DIAGNOSIS — Z419 Encounter for procedure for purposes other than remedying health state, unspecified: Secondary | ICD-10-CM | POA: Diagnosis not present

## 2020-07-07 DIAGNOSIS — Z419 Encounter for procedure for purposes other than remedying health state, unspecified: Secondary | ICD-10-CM | POA: Diagnosis not present

## 2020-08-07 DIAGNOSIS — Z419 Encounter for procedure for purposes other than remedying health state, unspecified: Secondary | ICD-10-CM | POA: Diagnosis not present

## 2020-09-06 DIAGNOSIS — Z419 Encounter for procedure for purposes other than remedying health state, unspecified: Secondary | ICD-10-CM | POA: Diagnosis not present

## 2020-10-07 DIAGNOSIS — Z419 Encounter for procedure for purposes other than remedying health state, unspecified: Secondary | ICD-10-CM | POA: Diagnosis not present

## 2020-11-07 DIAGNOSIS — Z419 Encounter for procedure for purposes other than remedying health state, unspecified: Secondary | ICD-10-CM | POA: Diagnosis not present

## 2020-12-05 DIAGNOSIS — Z419 Encounter for procedure for purposes other than remedying health state, unspecified: Secondary | ICD-10-CM | POA: Diagnosis not present

## 2021-01-05 DIAGNOSIS — Z419 Encounter for procedure for purposes other than remedying health state, unspecified: Secondary | ICD-10-CM | POA: Diagnosis not present

## 2021-02-04 DIAGNOSIS — Z419 Encounter for procedure for purposes other than remedying health state, unspecified: Secondary | ICD-10-CM | POA: Diagnosis not present

## 2021-03-07 DIAGNOSIS — Z419 Encounter for procedure for purposes other than remedying health state, unspecified: Secondary | ICD-10-CM | POA: Diagnosis not present

## 2021-04-06 DIAGNOSIS — Z419 Encounter for procedure for purposes other than remedying health state, unspecified: Secondary | ICD-10-CM | POA: Diagnosis not present

## 2021-05-07 DIAGNOSIS — Z419 Encounter for procedure for purposes other than remedying health state, unspecified: Secondary | ICD-10-CM | POA: Diagnosis not present

## 2021-06-07 DIAGNOSIS — Z419 Encounter for procedure for purposes other than remedying health state, unspecified: Secondary | ICD-10-CM | POA: Diagnosis not present

## 2021-07-07 DIAGNOSIS — Z419 Encounter for procedure for purposes other than remedying health state, unspecified: Secondary | ICD-10-CM | POA: Diagnosis not present

## 2021-08-07 DIAGNOSIS — Z419 Encounter for procedure for purposes other than remedying health state, unspecified: Secondary | ICD-10-CM | POA: Diagnosis not present

## 2021-09-06 DIAGNOSIS — Z419 Encounter for procedure for purposes other than remedying health state, unspecified: Secondary | ICD-10-CM | POA: Diagnosis not present

## 2021-10-07 DIAGNOSIS — Z419 Encounter for procedure for purposes other than remedying health state, unspecified: Secondary | ICD-10-CM | POA: Diagnosis not present

## 2021-11-07 DIAGNOSIS — Z419 Encounter for procedure for purposes other than remedying health state, unspecified: Secondary | ICD-10-CM | POA: Diagnosis not present

## 2021-12-05 DIAGNOSIS — Z419 Encounter for procedure for purposes other than remedying health state, unspecified: Secondary | ICD-10-CM | POA: Diagnosis not present

## 2022-01-05 DIAGNOSIS — Z419 Encounter for procedure for purposes other than remedying health state, unspecified: Secondary | ICD-10-CM | POA: Diagnosis not present

## 2022-02-04 DIAGNOSIS — Z419 Encounter for procedure for purposes other than remedying health state, unspecified: Secondary | ICD-10-CM | POA: Diagnosis not present

## 2022-03-07 DIAGNOSIS — Z419 Encounter for procedure for purposes other than remedying health state, unspecified: Secondary | ICD-10-CM | POA: Diagnosis not present

## 2022-04-06 DIAGNOSIS — Z419 Encounter for procedure for purposes other than remedying health state, unspecified: Secondary | ICD-10-CM | POA: Diagnosis not present

## 2022-05-07 DIAGNOSIS — Z419 Encounter for procedure for purposes other than remedying health state, unspecified: Secondary | ICD-10-CM | POA: Diagnosis not present

## 2022-06-07 DIAGNOSIS — Z419 Encounter for procedure for purposes other than remedying health state, unspecified: Secondary | ICD-10-CM | POA: Diagnosis not present

## 2022-07-07 DIAGNOSIS — Z419 Encounter for procedure for purposes other than remedying health state, unspecified: Secondary | ICD-10-CM | POA: Diagnosis not present

## 2022-08-07 DIAGNOSIS — Z419 Encounter for procedure for purposes other than remedying health state, unspecified: Secondary | ICD-10-CM | POA: Diagnosis not present

## 2022-09-06 DIAGNOSIS — Z419 Encounter for procedure for purposes other than remedying health state, unspecified: Secondary | ICD-10-CM | POA: Diagnosis not present

## 2022-10-07 DIAGNOSIS — Z419 Encounter for procedure for purposes other than remedying health state, unspecified: Secondary | ICD-10-CM | POA: Diagnosis not present

## 2022-11-07 DIAGNOSIS — Z419 Encounter for procedure for purposes other than remedying health state, unspecified: Secondary | ICD-10-CM | POA: Diagnosis not present

## 2022-12-06 DIAGNOSIS — Z419 Encounter for procedure for purposes other than remedying health state, unspecified: Secondary | ICD-10-CM | POA: Diagnosis not present

## 2023-01-06 DIAGNOSIS — Z419 Encounter for procedure for purposes other than remedying health state, unspecified: Secondary | ICD-10-CM | POA: Diagnosis not present

## 2023-02-05 DIAGNOSIS — Z419 Encounter for procedure for purposes other than remedying health state, unspecified: Secondary | ICD-10-CM | POA: Diagnosis not present

## 2023-03-08 DIAGNOSIS — Z419 Encounter for procedure for purposes other than remedying health state, unspecified: Secondary | ICD-10-CM | POA: Diagnosis not present

## 2023-04-07 DIAGNOSIS — Z419 Encounter for procedure for purposes other than remedying health state, unspecified: Secondary | ICD-10-CM | POA: Diagnosis not present

## 2023-05-08 DIAGNOSIS — Z419 Encounter for procedure for purposes other than remedying health state, unspecified: Secondary | ICD-10-CM | POA: Diagnosis not present

## 2023-06-08 DIAGNOSIS — Z419 Encounter for procedure for purposes other than remedying health state, unspecified: Secondary | ICD-10-CM | POA: Diagnosis not present

## 2023-07-08 DIAGNOSIS — Z419 Encounter for procedure for purposes other than remedying health state, unspecified: Secondary | ICD-10-CM | POA: Diagnosis not present

## 2023-08-08 DIAGNOSIS — Z419 Encounter for procedure for purposes other than remedying health state, unspecified: Secondary | ICD-10-CM | POA: Diagnosis not present

## 2023-09-07 DIAGNOSIS — Z419 Encounter for procedure for purposes other than remedying health state, unspecified: Secondary | ICD-10-CM | POA: Diagnosis not present

## 2023-10-08 DIAGNOSIS — Z419 Encounter for procedure for purposes other than remedying health state, unspecified: Secondary | ICD-10-CM | POA: Diagnosis not present

## 2023-11-08 DIAGNOSIS — Z419 Encounter for procedure for purposes other than remedying health state, unspecified: Secondary | ICD-10-CM | POA: Diagnosis not present

## 2023-12-06 DIAGNOSIS — Z419 Encounter for procedure for purposes other than remedying health state, unspecified: Secondary | ICD-10-CM | POA: Diagnosis not present

## 2024-01-17 DIAGNOSIS — Z419 Encounter for procedure for purposes other than remedying health state, unspecified: Secondary | ICD-10-CM | POA: Diagnosis not present

## 2024-02-16 DIAGNOSIS — Z419 Encounter for procedure for purposes other than remedying health state, unspecified: Secondary | ICD-10-CM | POA: Diagnosis not present

## 2024-03-18 DIAGNOSIS — Z419 Encounter for procedure for purposes other than remedying health state, unspecified: Secondary | ICD-10-CM | POA: Diagnosis not present

## 2024-04-17 DIAGNOSIS — Z419 Encounter for procedure for purposes other than remedying health state, unspecified: Secondary | ICD-10-CM | POA: Diagnosis not present

## 2024-05-18 DIAGNOSIS — Z419 Encounter for procedure for purposes other than remedying health state, unspecified: Secondary | ICD-10-CM | POA: Diagnosis not present

## 2024-06-18 DIAGNOSIS — Z419 Encounter for procedure for purposes other than remedying health state, unspecified: Secondary | ICD-10-CM | POA: Diagnosis not present

## 2024-08-18 DIAGNOSIS — Z419 Encounter for procedure for purposes other than remedying health state, unspecified: Secondary | ICD-10-CM | POA: Diagnosis not present
